# Patient Record
Sex: Male | Born: 1995 | Race: Black or African American | Hispanic: No | Marital: Single | Smoking: Never smoker
Health system: Southern US, Community
[De-identification: ages and names within clinical notes are randomized; demographics above are authoritative.]

## PROBLEM LIST (undated history)

## (undated) DIAGNOSIS — D649 Anemia, unspecified: Secondary | ICD-10-CM

## (undated) DIAGNOSIS — R9431 Abnormal electrocardiogram [ECG] [EKG]: Secondary | ICD-10-CM

## (undated) DIAGNOSIS — G43909 Migraine, unspecified, not intractable, without status migrainosus: Secondary | ICD-10-CM

## (undated) DIAGNOSIS — M545 Low back pain, unspecified: Secondary | ICD-10-CM

## (undated) DIAGNOSIS — J3089 Other allergic rhinitis: Secondary | ICD-10-CM

## (undated) DIAGNOSIS — Z973 Presence of spectacles and contact lenses: Secondary | ICD-10-CM

## (undated) DIAGNOSIS — Z9289 Personal history of other medical treatment: Secondary | ICD-10-CM

## (undated) HISTORY — DX: Other allergic rhinitis: J30.89

## (undated) HISTORY — DX: Low back pain: M54.5

## (undated) HISTORY — DX: Anemia, unspecified: D64.9

## (undated) HISTORY — PX: HERNIA REPAIR: SHX51

## (undated) HISTORY — DX: Personal history of other medical treatment: Z92.89

## (undated) HISTORY — DX: Low back pain, unspecified: M54.50

## (undated) HISTORY — DX: Migraine, unspecified, not intractable, without status migrainosus: G43.909

## (undated) HISTORY — DX: Abnormal electrocardiogram (ECG) (EKG): R94.31

## (undated) HISTORY — DX: Presence of spectacles and contact lenses: Z97.3

---
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
## 2014-12-15 ENCOUNTER — Ambulatory Visit (INDEPENDENT_AMBULATORY_CARE_PROVIDER_SITE_OTHER): Payer: 59 | Admitting: Medical

## 2014-12-15 ENCOUNTER — Encounter: Payer: Self-pay | Admitting: Medical

## 2014-12-15 VITALS — BP 100/80 | HR 67 | Temp 98.4°F | Resp 16 | Ht 66.0 in | Wt 162.0 lb

## 2014-12-15 DIAGNOSIS — Z0000 Encounter for general adult medical examination without abnormal findings: Secondary | ICD-10-CM | POA: Diagnosis not present

## 2014-12-15 DIAGNOSIS — Z9109 Other allergy status, other than to drugs and biological substances: Secondary | ICD-10-CM

## 2014-12-15 DIAGNOSIS — Z91048 Other nonmedicinal substance allergy status: Secondary | ICD-10-CM

## 2014-12-15 DIAGNOSIS — M7522 Bicipital tendinitis, left shoulder: Secondary | ICD-10-CM | POA: Diagnosis not present

## 2014-12-15 DIAGNOSIS — E739 Lactose intolerance, unspecified: Secondary | ICD-10-CM | POA: Diagnosis not present

## 2014-12-15 DIAGNOSIS — J45990 Exercise induced bronchospasm: Secondary | ICD-10-CM | POA: Insufficient documentation

## 2014-12-15 DIAGNOSIS — M545 Low back pain, unspecified: Secondary | ICD-10-CM | POA: Insufficient documentation

## 2014-12-15 DIAGNOSIS — R4586 Emotional lability: Secondary | ICD-10-CM

## 2014-12-15 DIAGNOSIS — G43809 Other migraine, not intractable, without status migrainosus: Secondary | ICD-10-CM

## 2014-12-15 DIAGNOSIS — F39 Unspecified mood [affective] disorder: Secondary | ICD-10-CM | POA: Diagnosis not present

## 2014-12-15 DIAGNOSIS — J4530 Mild persistent asthma, uncomplicated: Secondary | ICD-10-CM

## 2014-12-15 LAB — POCT URINALYSIS DIPSTICK
BILIRUBIN UA: NEGATIVE
GLUCOSE UA: NEGATIVE
KETONES UA: NEGATIVE
Leukocytes, UA: NEGATIVE
NITRITE UA: NEGATIVE
PROTEIN UA: NEGATIVE
RBC UA: NEGATIVE
Spec Grav, UA: 1.025
UROBILINOGEN UA: NEGATIVE
pH, UA: 6

## 2014-12-15 LAB — LIPID PANEL
CHOL/HDL RATIO: 2.9 ratio
Cholesterol: 141 mg/dL (ref 0–200)
HDL: 49 mg/dL (ref 31–65)
LDL Cholesterol: 79 mg/dL (ref 0–99)
Triglycerides: 66 mg/dL (ref <150)
VLDL: 13 mg/dL (ref 0–40)

## 2014-12-15 LAB — CBC
HCT: 38.2 % — ABNORMAL LOW (ref 39.0–52.0)
HEMOGLOBIN: 12.2 g/dL — AB (ref 13.0–17.0)
MCH: 25 pg — AB (ref 26.0–34.0)
MCHC: 31.9 g/dL (ref 30.0–36.0)
MCV: 78.3 fL (ref 78.0–100.0)
MPV: 9.6 fL (ref 8.6–12.4)
PLATELETS: 259 10*3/uL (ref 150–400)
RBC: 4.88 MIL/uL (ref 4.22–5.81)
RDW: 14.4 % (ref 11.5–15.5)
WBC: 4.9 10*3/uL (ref 4.0–10.5)

## 2014-12-15 LAB — TSH: TSH: 0.985 u[IU]/mL (ref 0.350–4.500)

## 2014-12-15 LAB — COMPREHENSIVE METABOLIC PANEL
ALBUMIN: 4.4 g/dL (ref 3.5–5.2)
ALT: 11 U/L (ref 0–53)
AST: 30 U/L (ref 0–37)
Alkaline Phosphatase: 47 U/L (ref 39–117)
BUN: 10 mg/dL (ref 6–23)
CHLORIDE: 104 meq/L (ref 96–112)
CO2: 23 mEq/L (ref 19–32)
Calcium: 9.5 mg/dL (ref 8.4–10.5)
Creat: 0.99 mg/dL (ref 0.50–1.35)
Glucose, Bld: 67 mg/dL — ABNORMAL LOW (ref 70–99)
Potassium: 4 mEq/L (ref 3.5–5.3)
Sodium: 141 mEq/L (ref 135–145)
TOTAL PROTEIN: 7.1 g/dL (ref 6.0–8.3)
Total Bilirubin: 0.5 mg/dL (ref 0.2–1.1)

## 2014-12-15 MED ORDER — MONTELUKAST SODIUM 10 MG PO TABS: 10.0000 mg | ORAL_TABLET | Freq: Every day | ORAL | Status: DC

## 2014-12-15 MED ORDER — BECLOMETHASONE DIPROPIONATE 80 MCG/ACT IN AERS: 2.0000 | INHALATION_SPRAY | Freq: Two times a day (BID) | RESPIRATORY_TRACT | Status: DC

## 2014-12-15 MED ORDER — ALBUTEROL SULFATE HFA 108 (90 BASE) MCG/ACT IN AERS: 2.0000 | INHALATION_SPRAY | Freq: Four times a day (QID) | RESPIRATORY_TRACT | Status: DC | PRN

## 2014-12-15 NOTE — Progress Notes (Signed)
Subjective:   HPI  Zachary Morris is a 19 y.o. male who presents for a complete physical.  New patient today.  He has a list of questions.   Preventative care: Last physical or labs: 2013 somewhere else Sees dentist yearly: No Last tetanus vaccine, TD or Tdap: 2008   Concerns: Asthma - using albuterol 2-3 times per week, gets flare ups with allergies  Allergies - year round and cuts onions every evening at work which gets him flared up  Mood changes, depressed mood - has had 3 significant deaths he hasn't dealt with .  Had 2 coaches that were close to him die, 1 friend die in the last few years, no prior counseling or treatment  Lactose intolerant  Hx/o pars defect, back pain, saw [facility], gets low back pain often.  Exercises regularly, lifts weights, very active  Has left shoulder pain, anteirorly, saw [facility] for this in the past  Not sleeping well  Gets migraines 3+ times per month, not sure of trigger, but does drink a lot of caffeine  Reviewed their medical, surgical, family, social, medication, and allergy history and updated chart as appropriate.  Past Medical History  Diagnosis Date  . Asthma   . Perennial allergic rhinitis   . Wears glasses   . Migraine 2015    Past Surgical History  Procedure Laterality Date  . Hernia repair      umbilical    History   Social History  . Marital Status: Single    Spouse Name: N/A  . Number of Children: N/A  . Years of Education: N/A   Occupational History  . Not on file.   Social History Main Topics  . Smoking status: Never Smoker   . Smokeless tobacco: Not on file  . Alcohol Use: No  . Drug Use: No  . Sexual Activity: No   Other Topics Concern  . 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  Exercise - 5 days per week, [employer].  No children.  No current relationship.  W[employer]. Plans to attend [employer], considering PT  as a career.    Family History  Problem Relation Age of Onset  . Asthma Mother   . Hypertension Father   . Appendicitis Brother   . Hypertension Maternal Grandmother   . Arthritis Maternal Grandmother   . Hypertension Maternal Grandfather   . Arthritis Maternal Grandfather      Current outpatient prescriptions:  .  albuterol (PROVENTIL HFA;VENTOLIN HFA) 108 (90 BASE) MCG/ACT inhaler, Inhale 2 puffs into the lungs every 6 (six) hours as needed., Disp: 1 Inhaler, Rfl: 0 .  beclomethasone (QVAR) 80 MCG/ACT inhaler, Inhale 2 puffs into the lungs 2 (two) times daily., Disp: 1 Inhaler, Rfl: 2 .  montelukast (SINGULAIR) 10 MG tablet, Take 1 tablet (10 mg total) by mouth at bedtime., Disp: 30 tablet, Rfl: 2  No Known Allergies   Review of Systems Constitutional: -fever, -chills, -sweats, -unexpected weight change, -decreased appetite, -fatigue Allergy: -sneezing, -itching, +congestion Dermatology: -changing moles, --rash, -lumps ENT: +runny nose, -ear pain, -sore throat, -hoarseness, -sinus pain, -teeth pain, - ringing in ears, -hearing loss, -nosebleeds Cardiology: -chest pain, -palpitations, -swelling, -difficulty breathing when lying flat, -waking up short of breath Respiratory: -cough, -shortness of breath, +difficulty breathing with exercise or exertion, -wheezing, -coughing up blood Gastroenterology: -abdominal pain, -nausea, -vomiting, -diarrhea, -constipation, -blood in stool, -changes in  bowel movement, -difficulty swallowing or eating Hematology: -bleeding, -bruising  Musculoskeletal: -joint aches, -muscle aches, -joint swelling, +back pain, -neck pain, -cramping, -changes in gait Ophthalmology: denies vision changes, eye redness, itching, discharge Urology: -burning with urination, -difficulty urinating, -blood in urine, -urinary frequency, -urgency, -incontinence Neurology: +headache, -weakness, -tingling, -numbness, -memory loss, -falls, -dizziness Psychology: +depressed  mood, +agitation, +sleep problems     Objective:   Physical Exam  BP 100/80 mmHg  Pulse 67  Temp(Src) 98.4 F (36.9 C) (Oral)  Resp 16  Ht  (1.676 m)  Wt 162 lb (73.483 kg)  BMI 26.16 kg/m2  General appearance: alert, no distress, WD/WN, lean muscular AA male Skin: left lower face with 2mm round hypopigmented lesion, no other rash HEENT: normocephalic, conjunctiva/corneas normal, sclerae anicteric, PERRLA, EOMi, nares patent, no discharge or erythema, pharynx normal Oral cavity: MMM, tongue normal, teeth normal Neck: supple, no lymphadenopathy, no thyromegaly, no masses, normal ROM, no bruits Chest: non tender, normal shape and expansion Heart: RRR, normal S1, S2, no murmurs Lungs: CTA bilaterally, no wheezes, rhonchi, or rales Abdomen: +bs, soft, non tender, non distended, no masses, no hepatomegaly, no splenomegaly, no bruits Back: non tender, normal ROM, no scoliosis Musculoskeletal: tender over left bicep tendon, upper extremities non tender, no obvious deformity, normal ROM throughout, lower extremities non tender, no obvious deformity, normal ROM throughout Extremities: no edema, no cyanosis, no clubbing Pulses: 2+ symmetric, upper and lower extremities, normal cap refill Neurological: alert, oriented x 3, CN2-12 intact, strength normal upper extremities and lower extremities, sensation normal throughout, DTRs 2+ throughout, no cerebellar signs, gait normal Psychiatric: normal affect, behavior normal, pleasant , but tearful talking about his mood concerns, loss GU: normal male external genitalia, circumcised, nontender, no masses, no hernia, no lymphadenopathy Rectal: deferred due to age 54yo and no indication today   Assessment and Plan :    Encounter Diagnoses  Name Primary?  . Encounter for health maintenance examination in adult Yes  . Extrinsic asthma, mild persistent, uncomplicated   . Environmental allergies   . Mood changes   . Low back pain without  sciatica, unspecified back pain laterality   . Biceps tendonitis on left   . Lactose intolerance   . Other type of migraine     Physical exam - discussed healthy lifestyle, diet, exercise, preventative care, vaccinations, and addressed their concerns.   See your dentist yearly for routine dental care including hygiene visits twice yearly. See your eye doctor yearly for routine vision care.  Routine labs today.  Vaccinations: None today.    Other concerns today: Asthma - begin Qvar inhaler for control, 2 puffs BID, albuterol prn  Allergies - begin singular daily QHS  Mood changes - advised counseling, f/u soon to discuss further  Low back pain - will request prior records  Biceps tendonitnis- discussed diagnosis, treatment, f/u 2wk  Lactose intolerance - discussed concerns, ways to manage  Migraines - advised he cut back on caffeine, headache diary, recheck  Follow up 44mo

## 2014-12-15 NOTE — Patient Instructions (Signed)
Encounter Diagnoses  Name Primary?  . Encounter for health maintenance examination in adult Yes  . Extrinsic asthma, mild persistent, uncomplicated   . Environmental allergies   . Mood changes   . Low back pain without sciatica, unspecified back pain laterality   . Biceps tendonitis on left   . Lactose intolerance    Recommendations:  Begin Qvar inhaler 2 puffs twice daily for asthma control;  This is a CONTROLLER medication meant to be taken twice daily for prevention.   This is NOT a rescue inhaler.  Rinse your mouth out with water after use  Continue Albuterol RESCUE inhaler as needed, 2 puffs every 6 hours for wheezing, shortness of breath  Allergies - begin Singulair tablet once daily at bedtime  Mood changes - return to discuss further.  I also recommend you seek counseling  [facility]  Biceps tendonitis - for the next 2 weeks, use ice, OTC Aleve once daily, cut down your upper body weight lifting by half the weight you are doing, and recheck in 2 weeks  Lactose intolerance - consider Lactaid supplement OTC or avoid dairy products.  substitute other foods such as soy based milk if you drink milk for example  Counseling services   [facility] [phone] office [email or web address] [address] [place], [place], [place] [address]  Gale JourneyLaura Atkinson, therapist  Franchot ErichsenErik Nelson, MA, clinical psychologist  Cognitive-Behavior Therapy; Mood Disorders; Anxiety Disorders; adult and child ADHD; Family Therapy; Stress Management; personal growth, and Marital Therapy.    Carlus Pavlovennis McKnight Ph.D., clinical psychologist Cognitive-Behavior Therapy; Mood Disorders; Anxiety Disorders; Stress     Management   [facility] [address], [place], [place] [address] [phone]   [facility] Sheran LuzDesiree Wilkinson, psychotherapist [address] [place], [place]  [address] [phone]   Miguel AschoffElaine Talbert Ph.D., clinical psychologist [phone] office [address] [place], [place] [address] Cognitive Behavior Therapy, Depression, Bipolar, Anxiety, Grief and Loss

## 2014-12-16 LAB — HEMOGLOBIN A1C
Hgb A1c MFr Bld: 5.5 % (ref <5.7)
Mean Plasma Glucose: 111 mg/dL (ref <117)

## 2014-12-16 NOTE — Progress Notes (Signed)
Patient notified. Follow up appointment scheduled

## 2014-12-30 ENCOUNTER — Ambulatory Visit: Payer: 59 | Admitting: Medical

## 2015-01-03 ENCOUNTER — Encounter: Payer: Self-pay | Admitting: Medical

## 2015-01-06 ENCOUNTER — Ambulatory Visit (INDEPENDENT_AMBULATORY_CARE_PROVIDER_SITE_OTHER): Payer: 59 | Admitting: Medical

## 2015-01-06 ENCOUNTER — Encounter: Payer: Self-pay | Admitting: Medical

## 2015-01-06 VITALS — BP 110/70 | HR 58 | Temp 98.0°F | Resp 15 | Wt 165.0 lb

## 2015-01-06 DIAGNOSIS — R799 Abnormal finding of blood chemistry, unspecified: Secondary | ICD-10-CM

## 2015-01-06 DIAGNOSIS — J4530 Mild persistent asthma, uncomplicated: Secondary | ICD-10-CM

## 2015-01-06 DIAGNOSIS — D6489 Other specified anemias: Secondary | ICD-10-CM

## 2015-01-06 DIAGNOSIS — G4700 Insomnia, unspecified: Secondary | ICD-10-CM | POA: Diagnosis not present

## 2015-01-06 DIAGNOSIS — E162 Hypoglycemia, unspecified: Secondary | ICD-10-CM

## 2015-01-06 DIAGNOSIS — Z130 Encounter for screening for diseases of the blood and blood-forming organs and certain disorders involving the immune mechanism: Secondary | ICD-10-CM

## 2015-01-06 DIAGNOSIS — Z8669 Personal history of other diseases of the nervous system and sense organs: Secondary | ICD-10-CM

## 2015-01-06 DIAGNOSIS — F39 Unspecified mood [affective] disorder: Secondary | ICD-10-CM

## 2015-01-06 DIAGNOSIS — R4586 Emotional lability: Secondary | ICD-10-CM

## 2015-01-06 DIAGNOSIS — J309 Allergic rhinitis, unspecified: Secondary | ICD-10-CM | POA: Diagnosis not present

## 2015-01-06 DIAGNOSIS — R7309 Other abnormal glucose: Secondary | ICD-10-CM

## 2015-01-06 LAB — CBC WITH DIFFERENTIAL/PLATELET
Basophils Absolute: 0 10*3/uL (ref 0.0–0.1)
Basophils Relative: 0 % (ref 0–1)
EOS ABS: 0.2 10*3/uL (ref 0.0–0.7)
EOS PCT: 4 % (ref 0–5)
HCT: 37.9 % — ABNORMAL LOW (ref 39.0–52.0)
HEMOGLOBIN: 12.1 g/dL — AB (ref 13.0–17.0)
LYMPHS ABS: 1.6 10*3/uL (ref 0.7–4.0)
LYMPHS PCT: 33 % (ref 12–46)
MCH: 24.8 pg — AB (ref 26.0–34.0)
MCHC: 31.9 g/dL (ref 30.0–36.0)
MCV: 77.8 fL — AB (ref 78.0–100.0)
MONO ABS: 0.4 10*3/uL (ref 0.1–1.0)
MPV: 9.7 fL (ref 8.6–12.4)
Monocytes Relative: 8 % (ref 3–12)
Neutro Abs: 2.7 10*3/uL (ref 1.7–7.7)
Neutrophils Relative %: 55 % (ref 43–77)
Platelets: 210 10*3/uL (ref 150–400)
RBC: 4.87 MIL/uL (ref 4.22–5.81)
RDW: 14.9 % (ref 11.5–15.5)
WBC: 4.9 10*3/uL (ref 4.0–10.5)

## 2015-01-06 NOTE — Progress Notes (Signed)
  Subjective:   HPI  Zachary Morris is a 19 y.o. male who presents for recheck.  At his last visit which is a physical we discussed several concerns.   Asthma -  Using Qvar BID, feels fine since last visit, not wheezing.  Using albuterol not often now.    Allergies - didn't realize he had prescription for singualir.  Is taking OTC antishitamine.  Doing ok regarding allergy sympotms since last visit.    Headache improved since last visit, has had 1 small headache since last visit.  Hasn't started counseling since last visit but plans to see one soon, friend of mothers.  Sleeping better since last visit.   Reviewed their medical, surgical, family, social, medication, and allergy history and updated chart as appropriate.  Past Medical History  Diagnosis Date  . Asthma   . Perennial allergic rhinitis   . Wears glasses   . Migraine 2015    Past Surgical History  Procedure Laterality Date  . Hernia repair      umbilical     Objective:   Physical Exam  BP 110/70 mmHg  Pulse 58  Temp(Src) 98 F (36.7 C) (Oral)  Resp 15  Wt 165 lb (74.844 kg)  General appearance: alert, no distress, WD/WN, lean muscular AA male Neck: supple, no lymphadenopathy, no thyromegaly, no masses, normal ROM, no bruits Heart: RRR, normal S1, S2, no murmurs Lungs: CTA bilaterally, no wheezes, rhonchi, or rales Extremities: no edema, no cyanosis, no clubbing Pulses: 2+ symmetric, upper and lower extremities, normal cap refill    Assessment and Plan :    Encounter Diagnoses  Name Primary?  Marland Kitchen. Asthma, extrinsic, mild persistent, uncomplicated Yes  . Allergic rhinitis, unspecified allergic rhinitis type   . Anemia due to other cause   . Screening for sickle-cell disease or trait   . Low glucose level   . Hx of migraines   . Insomnia   . Mood changes     Asthma - improved on current regimen, continue Qvar inhaler for control, 2 puffs BID, albuterol prn  Allergies - begin singular daily QHS,  continue over-the-counter antihistamine  We discussed abnormal labs from last visit, repeat labs today   Mood changes - advised counseling  Migraines - improved, limit caffeine, headache diary  Insomnia-improved  Follow up pending labs

## 2015-01-07 ENCOUNTER — Other Ambulatory Visit: Payer: Self-pay | Admitting: Medical

## 2015-01-07 LAB — IRON AND TIBC
%SAT: 7 % — AB (ref 20–55)
Iron: 25 ug/dL — ABNORMAL LOW (ref 42–165)
TIBC: 368 ug/dL (ref 215–435)
UIBC: 343 ug/dL (ref 125–400)

## 2015-01-07 LAB — SICKLE CELL SCREEN: Sickle Cell Screen: NEGATIVE

## 2015-01-07 LAB — GLUCOSE, RANDOM: GLUCOSE: 85 mg/dL (ref 70–99)

## 2015-01-07 LAB — CORTISOL-AM, BLOOD: Cortisol - AM: 9.5 ug/dL (ref 4.3–22.4)

## 2015-01-07 MED ORDER — FERROUS GLUCONATE 324 (38 FE) MG PO TABS: 324.0000 mg | ORAL_TABLET | Freq: Two times a day (BID) | ORAL | Status: DC

## 2016-02-14 ENCOUNTER — Ambulatory Visit (HOSPITAL_COMMUNITY)
Admission: EM | Admit: 2016-02-14 | Discharge: 2016-02-14 | Disposition: A | Discharge status: Home / Self Care | Payer: Managed Care, Other (non HMO) | Attending: Emergency Medicine | Admitting: Emergency Medicine

## 2016-02-14 ENCOUNTER — Ambulatory Visit (INDEPENDENT_AMBULATORY_CARE_PROVIDER_SITE_OTHER): Payer: Managed Care, Other (non HMO)

## 2016-02-14 ENCOUNTER — Encounter (HOSPITAL_COMMUNITY): Payer: Self-pay | Admitting: Emergency Medicine

## 2016-02-14 DIAGNOSIS — M25511 Pain in right shoulder: Secondary | ICD-10-CM | POA: Insufficient documentation

## 2016-02-14 DIAGNOSIS — Z79899 Other long term (current) drug therapy: Secondary | ICD-10-CM | POA: Insufficient documentation

## 2016-02-14 DIAGNOSIS — J45909 Unspecified asthma, uncomplicated: Secondary | ICD-10-CM | POA: Diagnosis not present

## 2016-02-14 DIAGNOSIS — S46911A Strain of unspecified muscle, fascia and tendon at shoulder and upper arm level, right arm, initial encounter: Secondary | ICD-10-CM | POA: Diagnosis not present

## 2016-02-14 DIAGNOSIS — Z8249 Family history of ischemic heart disease and other diseases of the circulatory system: Secondary | ICD-10-CM | POA: Diagnosis not present

## 2016-02-14 DIAGNOSIS — Z825 Family history of asthma and other chronic lower respiratory diseases: Secondary | ICD-10-CM | POA: Diagnosis not present

## 2016-02-14 DIAGNOSIS — Z202 Contact with and (suspected) exposure to infections with a predominantly sexual mode of transmission: Secondary | ICD-10-CM | POA: Diagnosis not present

## 2016-02-14 MED ORDER — MELOXICAM 15 MG PO TABS: 15.0000 mg | ORAL_TABLET | Freq: Every day | ORAL | Status: DC

## 2016-02-14 MED ORDER — CEFTRIAXONE SODIUM 250 MG IJ SOLR
INTRAMUSCULAR | Status: AC
  Filled 2016-02-14: qty 250

## 2016-02-14 MED ORDER — AZITHROMYCIN 250 MG PO TABS
1000.0000 mg | ORAL_TABLET | Freq: Once | ORAL | Status: AC
  Administered 2016-02-14: 1000 mg via ORAL

## 2016-02-14 MED ORDER — AZITHROMYCIN 250 MG PO TABS
ORAL_TABLET | ORAL | Status: AC
  Filled 2016-02-14: qty 4

## 2016-02-14 MED ORDER — CEFTRIAXONE SODIUM 250 MG IJ SOLR
250.0000 mg | Freq: Once | INTRAMUSCULAR | Status: AC
  Administered 2016-02-14: 250 mg via INTRAMUSCULAR

## 2016-02-14 MED ORDER — LIDOCAINE HCL (PF) 1 % IJ SOLN
INTRAMUSCULAR | Status: AC
  Filled 2016-02-14: qty 5

## 2016-02-14 NOTE — ED Notes (Signed)
Patient c/o right shoulder pain following some horse play. He has been using ice and heat and OTC pain relievers with no relief. Patient reports he also would like to be checked for Chylamydia as he had a partner tell him about 2 days ago she was positive for it. Patient is asymptomatic.

## 2016-02-14 NOTE — ED Provider Notes (Signed)
CSN: [id number]     Arrival date & time 02/14/16  1303 History   First MD Initiated Contact with Patient 02/14/16 1326     Chief Complaint  Patient presents with  . Shoulder Pain  . Exposure to STD   (Consider location/radiation/quality/duration/timing/severity/associated sxs/prior Treatment) HPI  He is a 20 year old man here for evaluation of right shoulder pain and exposure to STD. He states on Sunday he was messing around and jumped up to hit cement column. When he hit the column, he felt a pop in his right shoulder. He has had pain in the shoulder since then. He has done ice and ibuprofen without improvement. Pain is worse with overhead movements, causing some limited range of motion. He denies any radiating pain. No weakness. Pain is mostly in the anterior shoulder. He also states his trapezius on that side has been very tight.  He received a phone call 2 days ago that a former partner tested positive for chlamydia. He denies any dysuria or discharge.  Past Medical History  Diagnosis Date  . Asthma   . Perennial allergic rhinitis   . Wears glasses   . Migraine 2015   Past Surgical History  Procedure Laterality Date  . Hernia repair      umbilical   Family History  Problem Relation Age of Onset  . Asthma Mother   . Hypertension Father   . Appendicitis Brother   . Hypertension Maternal Grandmother   . Arthritis Maternal Grandmother   . Hypertension Maternal Grandfather   . Arthritis Maternal Grandfather    Social History  Substance Use Topics  . Smoking status: Never Smoker   . Smokeless tobacco: None  . Alcohol Use: No    Review of Systems As in history of present illness Allergies  Review of patient's allergies indicates no known allergies.  Home Medications   Prior to Admission medications   Medication Sig Start Date End Date Taking? Authorizing Provider  albuterol (PROVENTIL HFA;VENTOLIN HFA) 108 (90 BASE) MCG/ACT inhaler Inhale 2 puffs into the lungs every 6  (six) hours as needed. 12/15/14   Kermit Baloavid S Tysinger, PA-C  beclomethasone (QVAR) 80 MCG/ACT inhaler Inhale 2 puffs into the lungs 2 (two) times daily. 12/15/14   Kermit Baloavid S Tysinger, PA-C  ferrous gluconate (FERGON) 324 MG tablet Take 1 tablet (324 mg total) by mouth 2 (two) times daily with a meal. 01/07/15   Kermit Baloavid S Tysinger, PA-C  meloxicam (MOBIC) 15 MG tablet Take 1 tablet (15 mg total) by mouth daily. For 1 week, then as needed 02/14/16   Charm RingsErin J Rosine Solecki, MD   Meds Ordered and Administered this Visit   Medications  azithromycin [facility](ZITHROMAX) tablet 1,000 mg (1,000 mg Oral Given 02/14/16 1417)  cefTRIAXone (ROCEPHIN) injection 250 mg (250 mg Intramuscular Given 02/14/16 1417)    BP 145/103 mmHg  Pulse 75  Temp(Src) 98.5 F (36.9 C) (Oral)  Resp 16  SpO2 100% No data found.   Physical Exam  Constitutional: He is oriented to person, place, and time. He appears well-developed and well-nourished. No distress.  Cardiovascular: Normal rate.   Pulmonary/Chest: Effort normal.  Musculoskeletal:  Right shoulder: He does have some swelling at the ac joint as compared to the left.  He also has some spasm of the right trapezius. He is tender over the ac joint as well as the biceps tendon. Positive empty can. Negative Hawkins and Neer's. Normal biceps testing. He does have normal range of motion, but pain with abduction beyond 90. 2+  radial pulse.  Neurological: He is alert and oriented to person, place, and time.    ED Course  Procedures (including critical care time)  Labs Review Labs Reviewed  URINE CYTOLOGY ANCILLARY ONLY    Imaging Review Dg Shoulder Right  02/14/2016  CLINICAL DATA:  Base right arm and felt a popping sensation several days ago with pain anteriorly EXAM: RIGHT SHOULDER - 2+ VIEW COMPARISON:  None. FINDINGS: The right humeral head is in normal and the glenohumeral joint space appears normal. No significant posttraumatic or degenerative change is seen. The [facility] joint is normally aligned.  The ribs that are visualized are intact. IMPRESSION: Negative. Electronically Signed   By: Dwyane Dee M.D.   On: 02/14/2016 14:30      MDM   1. Right shoulder strain, initial encounter   2. STD exposure    I suspect he sprained the [facility] joint as well as possibly rotator cuff tendons. Treat with relative rest, ice, and meloxicam.  No weight lifting for 1 week. If not improving over the next week, follow-up with Delbert Harness. He was treated presumptively for gonorrhea and Chlamydia today. We will call with STD testing results. Follow-up as needed.    Charm Rings, MD 02/14/16 [phone]

## 2016-02-14 NOTE — ED Notes (Signed)
Patient reports no signs of reaction after antibiotic.

## 2016-02-14 NOTE — Discharge Instructions (Signed)
Your shoulder x-ray is normal. He likely strained the tendons and ligaments of the shoulder. Continue to apply ice at least 3 times a day. Take meloxicam daily for the next week, then as needed. No lifting for one week. This should gradually improve. If it is not getting better, follow up with Delbert HarnessMurphy Wainer.  We treated you for gonorrhea and Chlamydia today. We will call you with your results in 2-3 days. No sex for 1 week. Please make sure you use condoms.

## 2016-02-15 ENCOUNTER — Telehealth: Payer: Self-pay | Admitting: Medical

## 2016-02-15 LAB — URINE CYTOLOGY ANCILLARY ONLY
Chlamydia: POSITIVE — AB
NEISSERIA GONORRHEA: NEGATIVE
TRICH (WINDOWPATH): NEGATIVE

## 2016-02-15 NOTE — Telephone Encounter (Signed)
Please get in touch with the patient about recent visit to the emergency department.  Remind them to use us first for non emergency issues.  The emergency dept is expensive, should be used primarily for emergencies, and they should be encouraged to come here for non emergency problems.  This saves every body money, saves them time, and is better for continuity of care.  If they are ever unsure, they can always call here first, unless after hours or weekends.   

## 2016-02-17 NOTE — Telephone Encounter (Signed)
Pt is aware that he should come here for all non emergency visits. Stated he understood that we would prefer he use use verses the emergency in cases like this most recent visit which was not an emergency.

## 2016-02-17 NOTE — Telephone Encounter (Signed)
LMTCB

## 2016-02-28 ENCOUNTER — Telehealth: Payer: Self-pay | Admitting: Family Medicine

## 2016-02-28 NOTE — Telephone Encounter (Signed)
ER letter sent 

## 2016-05-28 ENCOUNTER — Ambulatory Visit (INDEPENDENT_AMBULATORY_CARE_PROVIDER_SITE_OTHER): Payer: Managed Care, Other (non HMO) | Admitting: Family Medicine

## 2016-05-28 ENCOUNTER — Encounter: Payer: Self-pay | Admitting: Family Medicine

## 2016-05-28 VITALS — BP 110/70 | HR 89 | Wt 170.0 lb

## 2016-05-28 DIAGNOSIS — J45990 Exercise induced bronchospasm: Secondary | ICD-10-CM | POA: Diagnosis not present

## 2016-05-28 DIAGNOSIS — L989 Disorder of the skin and subcutaneous tissue, unspecified: Secondary | ICD-10-CM | POA: Diagnosis not present

## 2016-05-28 MED ORDER — ALBUTEROL SULFATE HFA 108 (90 BASE) MCG/ACT IN AERS: 2.0000 | INHALATION_SPRAY | Freq: Four times a day (QID) | RESPIRATORY_TRACT | 1 refills | Status: DC | PRN

## 2016-05-28 NOTE — Progress Notes (Signed)
   Subjective:    Patient ID: Zachary Morris, male    DOB: [birth year], 20 y.o.   MRN: [id number]  HPI He has a lesion present on the right cheek for the last several weeks that he would like evaluated and he has very little difficulty with this. He would also like a refill on his albuterol. He does use this for exercise-induced asthma. He does have a previous history of asthma but has not been using any regular inhaler for quite some time.   Review of Systems     Objective:   Physical Exam Alert and in no distress. Round slightly dry lesion with underlying induration is noted on the right cheek.       Assessment & Plan:  Facial lesion - Plan: Ambulatory referral to Dermatology  Exercise-induced asthma - Plan: albuterol (PROVENTIL HFA;VENTOLIN HFA) 108 (90 Base) MCG/ACT inhaler The etiology of this lesion is unclear and I will therefore refer to dermatology.

## 2016-06-04 ENCOUNTER — Telehealth: Payer: Self-pay

## 2016-06-04 NOTE — Telephone Encounter (Signed)
LM on pt VCM due for physical and needs to CB. Trixie Rude/RLB

## 2016-06-27 ENCOUNTER — Ambulatory Visit (INDEPENDENT_AMBULATORY_CARE_PROVIDER_SITE_OTHER): Payer: Managed Care, Other (non HMO) | Admitting: Medical

## 2016-06-27 ENCOUNTER — Encounter: Payer: Self-pay | Admitting: Medical

## 2016-06-27 VITALS — BP 122/70 | HR 68 | Resp 18 | Ht 65.25 in | Wt 168.4 lb

## 2016-06-27 DIAGNOSIS — Z91048 Other nonmedicinal substance allergy status: Secondary | ICD-10-CM | POA: Diagnosis not present

## 2016-06-27 DIAGNOSIS — E739 Lactose intolerance, unspecified: Secondary | ICD-10-CM | POA: Diagnosis not present

## 2016-06-27 DIAGNOSIS — D509 Iron deficiency anemia, unspecified: Secondary | ICD-10-CM

## 2016-06-27 DIAGNOSIS — Z9109 Other allergy status, other than to drugs and biological substances: Secondary | ICD-10-CM

## 2016-06-27 DIAGNOSIS — Z0000 Encounter for general adult medical examination without abnormal findings: Secondary | ICD-10-CM

## 2016-06-27 DIAGNOSIS — Z8669 Personal history of other diseases of the nervous system and sense organs: Secondary | ICD-10-CM | POA: Diagnosis not present

## 2016-06-27 DIAGNOSIS — Z113 Encounter for screening for infections with a predominantly sexual mode of transmission: Secondary | ICD-10-CM | POA: Insufficient documentation

## 2016-06-27 DIAGNOSIS — J45990 Exercise induced bronchospasm: Secondary | ICD-10-CM | POA: Diagnosis not present

## 2016-06-27 LAB — CBC WITH DIFFERENTIAL/PLATELET
BASOS ABS: 0 {cells}/uL (ref 0–200)
Basophils Relative: 0 %
EOS ABS: 118 {cells}/uL (ref 15–500)
Eosinophils Relative: 2 %
HEMATOCRIT: 41.8 % (ref 38.5–50.0)
HEMOGLOBIN: 14.1 g/dL (ref 13.2–17.1)
LYMPHS ABS: 1475 {cells}/uL (ref 850–3900)
Lymphocytes Relative: 25 %
MCH: 27.1 pg (ref 27.0–33.0)
MCHC: 33.7 g/dL (ref 32.0–36.0)
MCV: 80.4 fL (ref 80.0–100.0)
MPV: 9.8 fL (ref 7.5–12.5)
Monocytes Absolute: 472 cells/uL (ref 200–950)
Monocytes Relative: 8 %
NEUTROS ABS: 3835 {cells}/uL (ref 1500–7800)
NEUTROS PCT: 65 %
Platelets: 186 10*3/uL (ref 140–400)
RBC: 5.2 MIL/uL (ref 4.20–5.80)
RDW: 13.7 % (ref 11.0–15.0)
WBC: 5.9 10*3/uL (ref 4.0–10.5)

## 2016-06-27 LAB — COMPREHENSIVE METABOLIC PANEL
ALBUMIN: 4.7 g/dL (ref 3.6–5.1)
ALT: 11 U/L (ref 9–46)
AST: 21 U/L (ref 10–40)
Alkaline Phosphatase: 44 U/L (ref 40–115)
BUN: 15 mg/dL (ref 7–25)
CALCIUM: 9.7 mg/dL (ref 8.6–10.3)
CHLORIDE: 104 mmol/L (ref 98–110)
CO2: 26 mmol/L (ref 20–31)
CREATININE: 1.19 mg/dL (ref 0.60–1.35)
Glucose, Bld: 80 mg/dL (ref 65–99)
Potassium: 4.2 mmol/L (ref 3.5–5.3)
Sodium: 140 mmol/L (ref 135–146)
TOTAL PROTEIN: 7.2 g/dL (ref 6.1–8.1)
Total Bilirubin: 0.6 mg/dL (ref 0.2–1.2)

## 2016-06-27 LAB — POCT URINALYSIS DIPSTICK
BILIRUBIN UA: NEGATIVE
Blood, UA: NEGATIVE
GLUCOSE UA: NEGATIVE
KETONES UA: NEGATIVE
LEUKOCYTES UA: NEGATIVE
Nitrite, UA: NEGATIVE
PROTEIN UA: NEGATIVE
Urobilinogen, UA: NEGATIVE
pH, UA: 6

## 2016-06-27 LAB — HIV ANTIBODY (ROUTINE TESTING W REFLEX): HIV: NONREACTIVE

## 2016-06-27 MED ORDER — ALBUTEROL SULFATE HFA 108 (90 BASE) MCG/ACT IN AERS
2.0000 | INHALATION_SPRAY | Freq: Four times a day (QID) | RESPIRATORY_TRACT | 2 refills | Status: DC | PRN
Start: 2016-06-27 — End: 2017-09-06

## 2016-06-27 MED ORDER — FLUTICASONE PROPIONATE HFA 110 MCG/ACT IN AERO: 2.0000 | INHALATION_SPRAY | Freq: Every day | RESPIRATORY_TRACT | 2 refills | Status: DC

## 2016-06-27 NOTE — Addendum Note (Signed)
Addended by: Debbrah AlarSMITH, Emilyrose Darrah F on: 06/27/2016 03:37 PM   Modules accepted: Orders

## 2016-06-27 NOTE — Progress Notes (Signed)
Subjective:   HPI  Zachary Morris is a 20 y.o. male who presents for a complete physical.    Concerns: Exercise - [employer], weights, some cardio  Iron deficiency anemia - eating lots of vegetables.  Feels improved from last year.  Not taking iron.   Sexually active, using condoms.   Asthma -  using albuterol 2-3 times per week, gets flare ups with allergies  Allergies - more seasonal in past year  Denies depressed mood, sometimes worrisome about success , failure.   training to become personal trainer, and wants to go back to school to become a International aid/development worker.  Reviewed their medical, surgical, family, social, medication, and allergy history and updated chart as appropriate.  Past Medical History:  Diagnosis Date  . Asthma   . Migraine 2015  . Perennial allergic rhinitis   . Wears glasses     Past Surgical History:  Procedure Laterality Date  . HERNIA REPAIR     umbilical    Social History   Social History  . Marital status: Single    Spouse name: N/A  . Number of children: N/A  . Years of education: N/A   Occupational History  . Not on file.   Social History Main Topics  . Smoking status: Never Smoker  . Smokeless tobacco: Not on file  . Alcohol use No  . Drug use: No  . Sexual activity: No   Other Topics Concern  . 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  Exercise - 5 days per week, [employer].  No children.  No current relationship.  Went to [employer]. Plans to attend [employer], considering PT as a career.    Family History  Problem Relation Age of Onset  . Asthma Mother   . Hypertension Father   . Appendicitis Brother   . Hypertension Maternal Grandmother   . Arthritis Maternal Grandmother   . Hypertension Maternal Grandfather   . Arthritis Maternal Grandfather      Current Outpatient Prescriptions:  .  albuterol (PROVENTIL HFA;VENTOLIN HFA) 108 (90 Base) MCG/ACT inhaler,  Inhale 2 puffs into the lungs every 6 (six) hours as needed., Disp: 1 Inhaler, Rfl: 1  No Known Allergies   Review of Systems Constitutional: -fever, -chills, -sweats, -unexpected weight change, -decreased appetite, -fatigue Allergy: -sneezing, -itching, -congestion Dermatology: -changing moles, --rash, -lumps ENT: -runny nose, -ear pain, -sore throat, -hoarseness, -sinus pain, -teeth pain, - ringing in ears, -hearing loss, -nosebleeds Cardiology: -chest pain, -palpitations, -swelling, -difficulty breathing when lying flat, -waking up short of breath Respiratory: -cough, -shortness of breath, -difficulty breathing with exercise or exertion, -wheezing, -coughing up blood Gastroenterology: -abdominal pain, -nausea, -vomiting, -diarrhea, -constipation, -blood in stool, -changes in bowel movement, -difficulty swallowing or eating Hematology: -bleeding, -bruising  Musculoskeletal: -joint aches, -muscle aches, -joint swelling, +back pain, -neck pain, -cramping, -changes in gait Ophthalmology: denies vision changes, eye redness, itching, discharge Urology: -burning with urination, -difficulty urinating, -blood in urine, -urinary frequency, -urgency, -incontinence Neurology: -headache, -weakness, -tingling, -numbness, -memory loss, -falls, -dizziness Psychology: -depressed mood, -agitation, -sleep problems     Objective:   Physical Exam  BP 122/70   Pulse 68   Resp 18   Ht 5' 5.25" (1.657 m)   Wt 168 lb 6.4 oz (76.4 kg)   BMI 27.81 kg/m   General appearance: alert, no distress, WD/WN, lean muscular AA male Skin: small 1 mm brown flat macules of  dorsal distal left 5th finger over DIP, no other worrisome lesions, tattoo right forearm anteriorly HEENT: normocephalic, conjunctiva/corneas normal, sclerae anicteric, PERRLA, EOMi, nares patent, no discharge or erythema, pharynx normal Oral cavity: MMM, tongue normal, teeth normal, whitish patches bilat buccal mucosa from biting  Neck: supple, no  lymphadenopathy, no thyromegaly, no masses, normal ROM, no bruits Chest: non tender, normal shape and expansion Heart: RRR, normal S1, S2, no murmurs Lungs: CTA bilaterally, no wheezes, rhonchi, or rales Abdomen: +bs, soft, non tender, non distended, no masses, no hepatomegaly, no splenomegaly, no bruits Back: non tender, normal ROM, no scoliosis Musculoskeletal: tender over left bicep tendon, upper extremities non tender, no obvious deformity, normal ROM throughout, lower extremities non tender, no obvious deformity, normal ROM throughout Extremities: no edema, no cyanosis, no clubbing Pulses: 2+ symmetric, upper and lower extremities, normal cap refill Neurological: alert, oriented x 3, CN2-12 intact, strength normal upper extremities and lower extremities, sensation normal throughout, DTRs 2+ throughout, no cerebellar signs, gait normal Psychiatric: normal affect, behavior normal, pleasant , but tearful talking about his mood concerns, loss GU: normal male external genitalia, circumcised, non tender, no masses, no hernia, no lymphadenopathy Rectal: deferred due to age 20yo and no indication today   Assessment and Plan :    Encounter Diagnoses  Name Primary?  . Annual physical exam Yes  . Exercise-induced asthma   . Lactose intolerance   . Environmental allergies   . History of migraine   . Anemia, iron deficiency   . Screen for STD (sexually transmitted disease)     Physical exam - discussed healthy lifestyle, diet, exercise, preventative care, vaccinations, and addressed their concerns.   See your dentist yearly for routine dental care including hygiene visits twice yearly. See your eye doctor yearly for routine vision care.  Routine labs today.  Vaccinations: Recommended flu shot but he declines.   Will be due for Tdap next year  Other concerns today: Asthma - begin Flovent inhaler for control, 2 puffs daily, albuterol prn  Allergies - c/t OTC antihistamine prior to  seasonal flares  Follow up  Pending labs  Stacie was seen today for annual exam.  Diagnoses and all orders for this visit:  Annual physical exam -     Urinalysis Dipstick -     Cancel: Visual acuity screening -     CBC with Differential/Platelet -     Comprehensive metabolic panel -     Iron and TIBC -     HIV antibody -     RPR -     GC/chlamydia probe amp, urine  Exercise-induced asthma -     albuterol (PROVENTIL HFA;VENTOLIN HFA) 108 (90 Base) MCG/ACT inhaler; Inhale 2 puffs into the lungs every 6 (six) hours as needed.  Lactose intolerance  Environmental allergies  History of migraine  Anemia, iron deficiency -     CBC with Differential/Platelet -     Iron and TIBC  Screen for STD (sexually transmitted disease) -     HIV antibody -     RPR -     GC/chlamydia probe amp, urine  Other orders -     fluticasone (FLOVENT HFA) 110 MCG/ACT inhaler; Inhale 2 puffs into the lungs daily.

## 2016-06-27 NOTE — Patient Instructions (Signed)
Begin trial of Flovent inhaler, 2 puffs daily about 30-60 minutes before exercise.  Rinse out mouth with water a few minutes after use  Use Albuterol inhaler as needed, 2 puffs every 4- 6 hours, or 2 puffs prior to aerobic execise  Use OTC antihistamine daily prior to allergy season.  We will cal with lab results.

## 2016-06-28 LAB — RPR

## 2016-06-28 LAB — GC/CHLAMYDIA PROBE AMP
CT PROBE, AMP APTIMA: NOT DETECTED
GC PROBE AMP APTIMA: NOT DETECTED

## 2016-08-20 ENCOUNTER — Telehealth: Payer: Self-pay

## 2016-08-20 ENCOUNTER — Other Ambulatory Visit: Payer: Self-pay | Admitting: Medical

## 2016-08-20 MED ORDER — FLUTICASONE PROPIONATE HFA 110 MCG/ACT IN AERO: 2.0000 | INHALATION_SPRAY | Freq: Every day | RESPIRATORY_TRACT | 5 refills | Status: DC

## 2016-08-20 NOTE — Telephone Encounter (Signed)
Fax request rcvd for refill of flovent to [facility]. Thanks, RLB

## 2016-08-22 NOTE — Telephone Encounter (Signed)
This encounter was created in error - please disregard.

## 2017-09-05 ENCOUNTER — Encounter: Payer: Self-pay | Admitting: Medical

## 2017-09-05 ENCOUNTER — Ambulatory Visit (INDEPENDENT_AMBULATORY_CARE_PROVIDER_SITE_OTHER): Payer: 59 | Admitting: Medical

## 2017-09-05 VITALS — BP 126/80 | HR 81 | Ht 65.0 in | Wt 171.8 lb

## 2017-09-05 DIAGNOSIS — Z0000 Encounter for general adult medical examination without abnormal findings: Secondary | ICD-10-CM | POA: Insufficient documentation

## 2017-09-05 DIAGNOSIS — J45990 Exercise induced bronchospasm: Secondary | ICD-10-CM

## 2017-09-05 DIAGNOSIS — Z9109 Other allergy status, other than to drugs and biological substances: Secondary | ICD-10-CM | POA: Diagnosis not present

## 2017-09-05 DIAGNOSIS — Z113 Encounter for screening for infections with a predominantly sexual mode of transmission: Secondary | ICD-10-CM | POA: Diagnosis not present

## 2017-09-05 DIAGNOSIS — Z23 Encounter for immunization: Secondary | ICD-10-CM | POA: Insufficient documentation

## 2017-09-05 DIAGNOSIS — Z8669 Personal history of other diseases of the nervous system and sense organs: Secondary | ICD-10-CM

## 2017-09-05 LAB — POCT URINALYSIS DIP (PROADVANTAGE DEVICE)
BILIRUBIN UA: NEGATIVE
GLUCOSE UA: NEGATIVE mg/dL
Ketones, POC UA: NEGATIVE mg/dL
Leukocytes, UA: NEGATIVE
Nitrite, UA: NEGATIVE
PH UA: 6 (ref 5.0–8.0)
RBC UA: NEGATIVE
SPECIFIC GRAVITY, URINE: 1.03
UUROB: NEGATIVE

## 2017-09-05 NOTE — Progress Notes (Signed)
Subjective:   HPI  Zachary Morris is a 21 y.o. male who presents for a complete physical.  Doing well, no particular issues.  Concerns: Exercise - [employer], weights, some cardio 1 yesterday and early way up the lateral as 9 mm positive Sexually active, using condoms.   Asthma -has had very little issues with asthma this past year, using albuterol as needed  Allergies - more seasonal in past year  Reviewed their medical, surgical, family, social, medication, and allergy history and updated chart as appropriate.  Past Medical History:  Diagnosis Date  . Abnormal EKG 08/2010   Subsequent normal echocardiogram, [facility]  . Anemia   . Asthma   . H/O echocardiogram 08/2010   Normal, Dr. Cristy FolksGregory Fleming, pediatric cardiology [facility]  . Low back pain   . Migraine 2015  . Perennial allergic rhinitis   . Wears glasses     Past Surgical History:  Procedure Laterality Date  . HERNIA REPAIR     umbilical    Social History   Socioeconomic History  . Marital status: Single    Spouse name: Not on file  . Number of children: Not on file  . Years of education: Not on file  . Highest education level: Not on file  Social Needs  . Financial resource strain: Not on file  . Food insecurity - worry: Not on file  . Food insecurity - inability: Not on file  . Transportation needs - medical: Not on file  . Transportation needs - non-medical: Not on file  Occupational History  . Not on file  Tobacco Use  . Smoking status: Never Smoker  . Smokeless tobacco: Never Used  Substance and Sexual Activity  . Alcohol use: No  . Drug use: No  . Sexual activity: No  Other Topics Concern  . 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  Exercise - 5 days per week, [employer].  No children.  [employer].  [employer], considering athletic training as a Event organisercareer.  Will begin [employer] [place] 2019.  08/2017     Family History  Problem Relation Age of Onset  . Asthma Mother   . Hypertension Father   . Appendicitis Brother   . Hypertension Maternal Grandmother   . Arthritis Maternal Grandmother   . Diabetes Maternal Grandmother   . Hypertension Maternal Grandfather   . Arthritis Maternal Grandfather   . Cancer Maternal Aunt        cervical cancer     Current Outpatient Medications:  .  albuterol (PROVENTIL HFA;VENTOLIN HFA) 108 (90 Base) MCG/ACT inhaler, Inhale 2 puffs into the lungs every 6 (six) hours as needed., Disp: 1 Inhaler, Rfl: 2  No Known Allergies   Review of Systems Constitutional: -fever, -chills, -sweats, -unexpected weight change, -decreased appetite, -fatigue Allergy: -sneezing, -itching, -congestion Dermatology: -changing moles, --rash, -lumps ENT: -runny nose, -ear pain, -sore throat, -hoarseness, -sinus pain, -teeth pain, - ringing in ears, -hearing loss, -nosebleeds Cardiology: -chest pain, -palpitations, -swelling, -difficulty breathing when lying flat, -waking up short of breath Respiratory: -cough, -shortness of breath, -difficulty breathing with exercise or exertion, -wheezing, -coughing up blood Gastroenterology: -abdominal pain, -nausea, -vomiting, -diarrhea, -constipation, -blood in stool, -changes in bowel movement, -difficulty swallowing or eating Hematology: -bleeding, -bruising  Musculoskeletal: -joint aches, -muscle aches, -joint swelling, +back pain, -neck pain, -cramping, -changes in gait Ophthalmology: denies vision changes, eye redness, itching, discharge Urology: -burning  with urination, -difficulty urinating, -blood in urine, -urinary frequency, -urgency, -incontinence Neurology: -headache, -weakness, -tingling, -numbness, -memory loss, -falls, -dizziness Psychology: -depressed mood, -agitation, -sleep problems     Objective:   Physical Exam  BP 126/80   Pulse 81   Ht 5\' 5"  (1.651 m)   Wt 171 lb 12.8 oz (77.9 kg)   SpO2 98%   BMI 28.59  kg/m   General appearance: alert, no distress, WD/WN, lean muscular AA male Skin: small 1 mm brown flat macules of dorsal distal left 5th finger over DIP, no other worrisome lesions, tattoo right forearm anteriorly HEENT: normocephalic, conjunctiva/corneas normal, sclerae anicteric, PERRLA, EOMi, nares patent, no discharge or erythema, pharynx normal Oral cavity: MMM, tongue normal, teeth normal, no lesions Neck: supple, no lymphadenopathy, no thyromegaly, no masses, normal ROM, no bruits Chest: non tender, normal shape and expansion Heart: RRR, normal S1, S2, no murmurs Lungs: CTA bilaterally, no wheezes, rhonchi, or rales Abdomen: +bs, soft, non tender, non distended, no masses, no hepatomegaly, no splenomegaly, no bruits Back: non tender, normal ROM, no scoliosis Musculoskeletal: tender over left bicep tendon, upper extremities non tender, no obvious deformity, normal ROM throughout, lower extremities non tender, no obvious deformity, normal ROM throughout Extremities: no edema, no cyanosis, no clubbing Pulses: 2+ symmetric, upper and lower extremities, normal cap refill Neurological: alert, oriented x 3, CN2-12 intact, strength normal upper extremities and lower extremities, sensation normal throughout, DTRs 2+ throughout, no cerebellar signs, gait normal Psychiatric: normal affect, behavior normal, pleasant , but tearful talking about his mood concerns, loss GU: normal male external genitalia, circumcised, non tender, no masses, no hernia, no lymphadenopathy Rectal: deferred due to age 89yo and no indication today   Assessment and Plan :    Encounter Diagnoses  Name Primary?  . Encounter for health maintenance examination in adult Yes  . History of migraine   . Environmental allergies   . Exercise-induced asthma   . Screen for STD (sexually transmitted disease)   . Need for Tdap vaccination     Physical exam - discussed healthy lifestyle, diet, exercise, preventative care,  vaccinations, and addressed their concerns.   See your dentist yearly for routine dental care including hygiene visits twice yearly. See your eye doctor yearly for routine vision care.  Routine labs today.  STD labs, discussed safe sex, condom use.  Vaccinations: Recommended flu shot but he declines.   Counseled on the Tdap (tetanus, diptheria, and acellular pertussis) vaccine.  Vaccine information sheet given. Tdap vaccine given after consent obtained.  Other concerns today: Asthma -continue albuterol prn  Allergies - c/t OTC antihistamine prior to seasonal flares  History of migraines-no recent problems  Follow up  Pending labs  Simon was seen today for annual exam.  Diagnoses and all orders for this visit:  Encounter for health maintenance examination in adult -     HIV antibody -     RPR -     C. trachomatis/N. gonorrhoeae RNA -     CBC -     Basic metabolic panel  History of migraine  Environmental allergies  Exercise-induced asthma  Screen for STD (sexually transmitted disease) -     HIV antibody -     RPR -     C. trachomatis/N. gonorrhoeae RNA  Need for Tdap vaccination

## 2017-09-06 ENCOUNTER — Other Ambulatory Visit: Payer: Self-pay

## 2017-09-06 DIAGNOSIS — J45990 Exercise induced bronchospasm: Secondary | ICD-10-CM

## 2017-09-06 LAB — BASIC METABOLIC PANEL
BUN: 15 mg/dL (ref 7–25)
CHLORIDE: 105 mmol/L (ref 98–110)
CO2: 25 mmol/L (ref 20–32)
Calcium: 9.3 mg/dL (ref 8.6–10.3)
Creat: 1.11 mg/dL (ref 0.60–1.35)
GLUCOSE: 81 mg/dL (ref 65–99)
POTASSIUM: 3.9 mmol/L (ref 3.5–5.3)
SODIUM: 138 mmol/L (ref 135–146)

## 2017-09-06 LAB — CBC
HEMATOCRIT: 42.8 % (ref 38.5–50.0)
Hemoglobin: 14.1 g/dL (ref 13.2–17.1)
MCH: 26.1 pg — ABNORMAL LOW (ref 27.0–33.0)
MCHC: 32.9 g/dL (ref 32.0–36.0)
MCV: 79.3 fL — ABNORMAL LOW (ref 80.0–100.0)
MPV: 10.6 fL (ref 7.5–12.5)
PLATELETS: 197 10*3/uL (ref 140–400)
RBC: 5.4 10*6/uL (ref 4.20–5.80)
RDW: 13.1 % (ref 11.0–15.0)
WBC: 4.2 10*3/uL (ref 3.8–10.8)

## 2017-09-06 LAB — C. TRACHOMATIS/N. GONORRHOEAE RNA
C. trachomatis RNA, TMA: NOT DETECTED
N. gonorrhoeae RNA, TMA: NOT DETECTED

## 2017-09-06 LAB — RPR: RPR Ser Ql: NONREACTIVE

## 2017-09-06 LAB — HIV ANTIBODY (ROUTINE TESTING W REFLEX): HIV: NONREACTIVE

## 2017-09-06 MED ORDER — ALBUTEROL SULFATE HFA 108 (90 BASE) MCG/ACT IN AERS: 2.0000 | INHALATION_SPRAY | Freq: Four times a day (QID) | RESPIRATORY_TRACT | 2 refills | Status: DC | PRN

## 2017-11-26 ENCOUNTER — Encounter (HOSPITAL_COMMUNITY): Payer: Self-pay | Admitting: Emergency Medicine

## 2017-11-26 ENCOUNTER — Ambulatory Visit (HOSPITAL_COMMUNITY)
Admission: EM | Admit: 2017-11-26 | Discharge: 2017-11-26 | Disposition: A | Discharge status: Home / Self Care | Payer: 59 | Attending: Emergency Medicine | Admitting: Emergency Medicine

## 2017-11-26 DIAGNOSIS — Z8249 Family history of ischemic heart disease and other diseases of the circulatory system: Secondary | ICD-10-CM | POA: Insufficient documentation

## 2017-11-26 DIAGNOSIS — E739 Lactose intolerance, unspecified: Secondary | ICD-10-CM | POA: Insufficient documentation

## 2017-11-26 DIAGNOSIS — J45909 Unspecified asthma, uncomplicated: Secondary | ICD-10-CM | POA: Insufficient documentation

## 2017-11-26 DIAGNOSIS — Z833 Family history of diabetes mellitus: Secondary | ICD-10-CM | POA: Insufficient documentation

## 2017-11-26 DIAGNOSIS — Z113 Encounter for screening for infections with a predominantly sexual mode of transmission: Secondary | ICD-10-CM

## 2017-11-26 DIAGNOSIS — Z202 Contact with and (suspected) exposure to infections with a predominantly sexual mode of transmission: Secondary | ICD-10-CM | POA: Diagnosis not present

## 2017-11-26 DIAGNOSIS — Z79899 Other long term (current) drug therapy: Secondary | ICD-10-CM | POA: Diagnosis not present

## 2017-11-26 DIAGNOSIS — Z9889 Other specified postprocedural states: Secondary | ICD-10-CM | POA: Diagnosis not present

## 2017-11-26 DIAGNOSIS — Z808 Family history of malignant neoplasm of other organs or systems: Secondary | ICD-10-CM | POA: Insufficient documentation

## 2017-11-26 DIAGNOSIS — Z8261 Family history of arthritis: Secondary | ICD-10-CM | POA: Diagnosis not present

## 2017-11-26 DIAGNOSIS — Z825 Family history of asthma and other chronic lower respiratory diseases: Secondary | ICD-10-CM | POA: Insufficient documentation

## 2017-11-26 MED ORDER — AZITHROMYCIN 250 MG PO TABS
1000.0000 mg | ORAL_TABLET | Freq: Once | ORAL | Status: AC
  Administered 2017-11-26: 1000 mg via ORAL

## 2017-11-26 MED ORDER — CEFTRIAXONE SODIUM 250 MG IJ SOLR
250.0000 mg | Freq: Once | INTRAMUSCULAR | Status: AC
  Administered 2017-11-26: 250 mg via INTRAMUSCULAR

## 2017-11-26 MED ORDER — AZITHROMYCIN 250 MG PO TABS
ORAL_TABLET | ORAL | Status: AC
  Filled 2017-11-26: qty 4

## 2017-11-26 MED ORDER — CEFTRIAXONE SODIUM 250 MG IJ SOLR
INTRAMUSCULAR | Status: AC
  Filled 2017-11-26: qty 250

## 2017-11-26 MED ORDER — LIDOCAINE HCL (PF) 1 % IJ SOLN
INTRAMUSCULAR | Status: AC
  Filled 2017-11-26: qty 2

## 2017-11-26 NOTE — Discharge Instructions (Signed)
Use protection  Expressed that we would send urine and if anything positive we would call pt.

## 2017-11-26 NOTE — ED Provider Notes (Signed)
[facility]    CSN: [id number] Arrival date & time: 11/26/17  1442     History   Chief Complaint Chief Complaint  Patient presents with  . Exposure to STD    HPI Zachary Morris is a 22 y.o. male.   Pt is here due to someone he had intercourse with approx 2 weeks ago expressed to him that she tested positive for an STI. Pt denies any sx but would like to be treated. Denies any reason to have HIV testing. Would like to send urine.       Past Medical History:  Diagnosis Date  . Abnormal EKG 08/2010   Subsequent normal echocardiogram, [facility]  . Anemia   . Asthma   . H/O echocardiogram 08/2010   Normal, Dr. Cristy FolksGregory Fleming, pediatric cardiology [facility]  . Low back pain   . Migraine 2015  . Perennial allergic rhinitis   . Wears glasses     Patient Active Problem List   Diagnosis Date Noted  . Encounter for health maintenance examination in adult 09/05/2017  . Need for Tdap vaccination 09/05/2017  . History of migraine 06/27/2016  . Screen for STD (sexually transmitted disease) 06/27/2016  . Exercise-induced asthma 12/15/2014  . Environmental allergies 12/15/2014  . Lactose intolerance 12/15/2014    Past Surgical History:  Procedure Laterality Date  . HERNIA REPAIR     umbilical       Home Medications    Prior to Admission medications   Medication Sig Start Date End Date Taking? Authorizing Provider  albuterol (PROVENTIL HFA;VENTOLIN HFA) 108 (90 Base) MCG/ACT inhaler Inhale 2 puffs into the lungs every 6 (six) hours as needed. 09/06/17   Tysinger, Kermit Baloavid S, PA-C  montelukast (SINGULAIR) 10 MG tablet Take 1 tablet (10 mg total) by mouth at bedtime. Patient not taking: Reported on 01/06/2015 12/15/14 02/14/16  Tysinger, Kermit Baloavid S, PA-C    Family History Family History  Problem Relation Age of Onset  . Asthma Mother   . Hypertension Father   . Appendicitis Brother   . Hypertension Maternal Grandmother   . Arthritis Maternal  Grandmother   . Diabetes Maternal Grandmother   . Hypertension Maternal Grandfather   . Arthritis Maternal Grandfather   . Cancer Maternal Aunt        cervical cancer    Social History Social History   Tobacco Use  . Smoking status: Never Smoker  . Smokeless tobacco: Never Used  Substance Use Topics  . Alcohol use: No  . Drug use: No     Allergies   Patient has no known allergies.   Review of Systems Review of Systems  Constitutional: Negative.   Respiratory: Negative.   Cardiovascular: Negative.   Gastrointestinal: Negative.   Genitourinary: Negative.   Skin: Negative.      Physical Exam Triage Vital Signs ED Triage Vitals  Enc Vitals Group     BP 11/26/17 1557 140/62     Pulse Rate 11/26/17 1557 68     Resp 11/26/17 1557 16     Temp 11/26/17 1557 99.1 F (37.3 C)     Temp Source 11/26/17 1557 Oral     SpO2 11/26/17 1557 100 %     Weight 11/26/17 1556 175 lb (79.4 kg)     Height --      Head Circumference --      Peak Flow --      Pain Score 11/26/17 1556 0     Pain Loc --  Pain Edu? --      Excl. in GC? --    No data found.  Updated Vital Signs BP 140/62   Pulse 68   Temp 99.1 F (37.3 C) (Oral)   Resp 16   Wt 175 lb (79.4 kg)   SpO2 100%   BMI 29.12 kg/m   Visual Acuity Right Eye Distance:   Left Eye Distance:   Bilateral Distance:    Right Eye Near:   Left Eye Near:    Bilateral Near:     Physical Exam  Constitutional: He appears well-developed.  Cardiovascular: Normal rate and regular rhythm.  Pulmonary/Chest: Effort normal and breath sounds normal.  Abdominal: Soft. Bowel sounds are normal.  Genitourinary: Penis normal.  Neurological: He is alert.     UC Treatments / Results  Labs (all labs ordered are listed, but only abnormal results are displayed) Labs Reviewed  URINE CYTOLOGY ANCILLARY ONLY    EKG  EKG Interpretation None       Radiology No results found.  Procedures Procedures (including critical  care time)  Medications Ordered in UC Medications  cefTRIAXone (ROCEPHIN) injection 250 mg (not administered)  azithromycin (ZITHROMAX) tablet 1,000 mg (not administered)     Initial Impression / Assessment and Plan / UC Course  I have reviewed the triage vital signs and the nursing notes.  Pertinent labs & imaging results that were available during my care of the patient were reviewed by me and considered in my medical decision making (see chart for details).    Expressed that we would send urine and if anything positive we would call pt.  Pt would like to be treated today does not feel the need for HIV or blood testing.   Final Clinical Impressions(s) / UC Diagnoses   Final diagnoses:  STD exposure    ED Discharge Orders    None       Controlled Substance Prescriptions [place] Controlled Substance Registry consulted? Not Applicable   Coralyn Mark, NP 11/26/17 [phone]

## 2017-11-26 NOTE — ED Triage Notes (Signed)
Requests STD check, no symptoms

## 2017-11-27 LAB — URINE CYTOLOGY ANCILLARY ONLY
CHLAMYDIA, DNA PROBE: NEGATIVE
Neisseria Gonorrhea: NEGATIVE
Trichomonas: NEGATIVE

## 2018-09-17 ENCOUNTER — Ambulatory Visit (INDEPENDENT_AMBULATORY_CARE_PROVIDER_SITE_OTHER): Payer: 59 | Admitting: Medical

## 2018-09-17 ENCOUNTER — Encounter: Payer: Self-pay | Admitting: Medical

## 2018-09-17 VITALS — BP 120/72 | HR 63 | Temp 98.1°F | Resp 16 | Ht 65.25 in | Wt 175.8 lb

## 2018-09-17 DIAGNOSIS — Z0000 Encounter for general adult medical examination without abnormal findings: Secondary | ICD-10-CM | POA: Diagnosis not present

## 2018-09-17 DIAGNOSIS — Z2821 Immunization not carried out because of patient refusal: Secondary | ICD-10-CM | POA: Insufficient documentation

## 2018-09-17 DIAGNOSIS — Z113 Encounter for screening for infections with a predominantly sexual mode of transmission: Secondary | ICD-10-CM

## 2018-09-17 LAB — POCT URINALYSIS DIP (PROADVANTAGE DEVICE)
Bilirubin, UA: NEGATIVE
Blood, UA: NEGATIVE
GLUCOSE UA: NEGATIVE mg/dL
Ketones, POC UA: NEGATIVE mg/dL
LEUKOCYTES UA: NEGATIVE
Nitrite, UA: NEGATIVE
Protein Ur, POC: NEGATIVE mg/dL
SPECIFIC GRAVITY, URINE: 1.03
UUROB: NEGATIVE
pH, UA: 5.5 (ref 5.0–8.0)

## 2018-09-17 NOTE — Progress Notes (Signed)
Subjective:   HPI  Zachary Morris is a 22 y.o. male who presents for a complete physical.  Doing well, no particular issues.  Concerns: Asthma -no recent problems  Allergies - more seasonal in past year  No recent headache issues  Reviewed their medical, surgical, family, social, medication, and allergy history and updated chart as appropriate.  Past Medical History:  Diagnosis Date  . Abnormal EKG 08/2010   Subsequent normal echocardiogram, [facility]  . Anemia   . Asthma   . H/O echocardiogram 08/2010   Normal, Dr. Cristy Folks, pediatric cardiology [facility]  . Low back pain   . Migraine 2015  . Perennial allergic rhinitis   . Wears glasses     Past Surgical History:  Procedure Laterality Date  . HERNIA REPAIR     umbilical    Social History   Socioeconomic History  . Marital status: Single    Spouse name: Not on file  . Number of children: Not on file  . Years of education: Not on file  . Highest education level: Not on file  Occupational History  . Not on file  Social Needs  . Financial resource strain: Not on file  . Food insecurity:    Worry: Not on file    Inability: Not on file  . Transportation needs:    Medical: Not on file    Non-medical: Not on file  Tobacco Use  . Smoking status: Never Smoker  . Smokeless tobacco: Never Used  Substance and Sexual Activity  . Alcohol use: No  . Drug use: No  . Sexual activity: Never  Lifestyle  . Physical activity:    Days per week: Not on file    Minutes per session: Not on file  . Stress: Not on file  Relationships  . Social connections:    Talks on phone: Not on file    Gets together: Not on file    Attends religious service: Not on file    Active member of club or organization: Not on file    Attends meetings of clubs or organizations: Not on file    Relationship status: Not on file  . Intimate partner violence:    Fear of current or ex partner: Not on file    Emotionally abused:  Not on file    Physically abused: Not on file    Forced sexual activity: Not on file  Other Topics Concern  . Not on file  Social History Narrative   Lives with mother and brother, International aid/development worker at [employer].  Exercise - 5 days per week, [employer], aerobic and weights.  No children.  Went to [employer].  [employer], considering athletic training as a Event organiser.  09/2018    Family History  Problem Relation Age of Onset  . Asthma Mother   . Hypertension Father   . Appendicitis Brother   . Hypertension Maternal Grandmother   . Arthritis Maternal Grandmother   . Diabetes Maternal Grandmother   . Hypertension Maternal Grandfather   . Arthritis Maternal Grandfather   . Cancer Maternal Aunt        cervical cancer     Current Outpatient Medications:  .  albuterol (PROVENTIL HFA;VENTOLIN HFA) 108 (90 Base) MCG/ACT inhaler, Inhale 2 puffs into the lungs every 6 (six) hours as needed., Disp: 1 Inhaler, Rfl: 2  No Known Allergies   Review of Systems Constitutional: -fever, -chills, -sweats, -unexpected weight change, -decreased appetite, -fatigue Allergy: -sneezing, -itching, -congestion  Dermatology: -changing moles, --rash, -lumps ENT: -runny nose, -ear pain, -sore throat, -hoarseness, -sinus pain, -teeth pain, - ringing in ears, -hearing loss, -nosebleeds Cardiology: -chest pain, -palpitations, -swelling, -difficulty breathing when lying flat, -waking up short of breath Respiratory: -cough, -shortness of breath, -difficulty breathing with exercise or exertion, -wheezing, -coughing up blood Gastroenterology: -abdominal pain, -nausea, -vomiting, -diarrhea, -constipation, -blood in stool, -changes in bowel movement, -difficulty swallowing or eating Hematology: -bleeding, -bruising  Musculoskeletal: -joint aches, -muscle aches, -joint swelling, -back pain, -neck pain, -cramping, -changes in gait Ophthalmology: denies vision changes, eye redness, itching, discharge Urology:  -burning with urination, -difficulty urinating, -blood in urine, -urinary frequency, -urgency, -incontinence Neurology: -headache, -weakness, -tingling, -numbness, -memory loss, -falls, -dizziness Psychology: -depressed mood, -agitation, -sleep problems     Objective:   Physical Exam  BP 120/72   Pulse 63   Temp 98.1 F (36.7 C) (Oral)   Resp 16   Ht 5' 5.25" (1.657 m)   Wt 175 lb 12.8 oz (79.7 kg)   SpO2 98%   BMI 29.03 kg/m   General appearance: alert, no distress, WD/WN, lean muscular AA male Skin: several tattoos, right lower anterior leg, upper abdomen, no worrisome lesions, tattoo right forearm anteriorly HEENT: normocephalic, conjunctiva/corneas normal, sclerae anicteric, PERRLA, EOMi, nares patent, no discharge or erythema, pharynx normal Oral cavity: MMM, tongue normal, teeth normal, no lesions Neck: supple, no lymphadenopathy, no thyromegaly, no masses, normal ROM, no bruits Chest: non tender, normal shape and expansion Heart: RRR, normal S1, S2, no murmurs Lungs: CTA bilaterally, no wheezes, rhonchi, or rales Abdomen: +bs, soft, non tender, non distended, no masses, no hepatomegaly, no splenomegaly, no bruits Back: non tender, normal ROM, no scoliosis Musculoskeletal: tender over left bicep tendon, upper extremities non tender, no obvious deformity, normal ROM throughout, lower extremities non tender, no obvious deformity, normal ROM throughout Extremities: no edema, no cyanosis, no clubbing Pulses: 2+ symmetric, upper and lower extremities, normal cap refill Neurological: alert, oriented x 3, CN2-12 intact, strength normal upper extremities and lower extremities, sensation normal throughout, DTRs 2+ throughout, no cerebellar signs, gait normal Psychiatric: normal affect, behavior normal, pleasant , but tearful talking about his mood concerns, loss GU: normal male external genitalia, circumcised, non tender, no masses, no hernia, no lymphadenopathy Rectal: deferred due  to age 22yo and no indication today    Assessment and Plan :    Encounter Diagnoses  Name Primary?  . Routine general medical examination at a health care facility Yes  . Screen for STD (sexually transmitted disease)   . Influenza vaccination declined     Physical exam - discussed healthy lifestyle, diet, exercise, preventative care, vaccinations, and addressed their concerns.   See your dentist yearly for routine dental care including hygiene visits twice yearly. See your eye doctor yearly for routine vision care.   Routine labs today.  STD labs, discussed safe sex, condom use.  Vaccinations: Recommended flu shot but he declines.   HPV up to date Td up to date  Other concerns today: Asthma -continue albuterol prn  Allergies - c/t OTC antihistamine prior to seasonal flares  History of migraines-no recent problems   Follow up  Pending labs  Jethro Bolusris was seen today for cpe.  Diagnoses and all orders for this visit:  Routine general medical examination at a health care facility -     POCT Urinalysis DIP (Proadvantage Device) -     Basic metabolic panel -     CBC -     HIV Antibody (  routine testing w rflx) -     RPR -     GC/Chlamydia Probe Amp  Screen for STD (sexually transmitted disease) -     HIV Antibody (routine testing w rflx) -     RPR -     GC/Chlamydia Probe Amp  Influenza vaccination declined

## 2018-09-18 LAB — CBC
HEMOGLOBIN: 14 g/dL (ref 13.0–17.7)
Hematocrit: 43 % (ref 37.5–51.0)
MCH: 26.7 pg (ref 26.6–33.0)
MCHC: 32.6 g/dL (ref 31.5–35.7)
MCV: 82 fL (ref 79–97)
Platelets: 202 10*3/uL (ref 150–450)
RBC: 5.24 x10E6/uL (ref 4.14–5.80)
RDW: 13.3 % (ref 12.3–15.4)
WBC: 4.8 10*3/uL (ref 3.4–10.8)

## 2018-09-18 LAB — HIV ANTIBODY (ROUTINE TESTING W REFLEX): HIV Screen 4th Generation wRfx: NONREACTIVE

## 2018-09-18 LAB — BASIC METABOLIC PANEL
BUN/Creatinine Ratio: 11 (ref 9–20)
BUN: 15 mg/dL (ref 6–20)
CHLORIDE: 103 mmol/L (ref 96–106)
CO2: 24 mmol/L (ref 20–29)
Calcium: 9.7 mg/dL (ref 8.7–10.2)
Creatinine, Ser: 1.31 mg/dL — ABNORMAL HIGH (ref 0.76–1.27)
GFR calc Af Amer: 88 mL/min/{1.73_m2} (ref >59)
GFR calc non Af Amer: 76 mL/min/{1.73_m2} (ref >59)
GLUCOSE: 68 mg/dL (ref 65–99)
Potassium: 4.4 mmol/L (ref 3.5–5.2)
SODIUM: 144 mmol/L (ref 134–144)

## 2018-09-18 LAB — RPR: RPR: NONREACTIVE

## 2018-09-19 LAB — GC/CHLAMYDIA PROBE AMP
Chlamydia trachomatis, NAA: NEGATIVE
Neisseria gonorrhoeae by PCR: NEGATIVE

## 2019-06-01 ENCOUNTER — Other Ambulatory Visit: Payer: Self-pay | Admitting: Medical

## 2019-06-01 DIAGNOSIS — J45990 Exercise induced bronchospasm: Secondary | ICD-10-CM

## 2019-06-01 MED ORDER — ALBUTEROL SULFATE HFA 108 (90 BASE) MCG/ACT IN AERS: 2.0000 | INHALATION_SPRAY | Freq: Four times a day (QID) | RESPIRATORY_TRACT | 1 refills | Status: DC | PRN

## 2019-11-19 ENCOUNTER — Other Ambulatory Visit: Payer: Self-pay

## 2019-11-19 ENCOUNTER — Encounter: Payer: Self-pay | Admitting: Medical

## 2019-11-19 ENCOUNTER — Ambulatory Visit (INDEPENDENT_AMBULATORY_CARE_PROVIDER_SITE_OTHER): Payer: Managed Care, Other (non HMO) | Admitting: Medical

## 2019-11-19 VITALS — BP 118/82 | HR 66 | Temp 98.2°F | Ht 65.0 in | Wt 182.0 lb

## 2019-11-19 DIAGNOSIS — Z9109 Other allergy status, other than to drugs and biological substances: Secondary | ICD-10-CM

## 2019-11-19 DIAGNOSIS — J45990 Exercise induced bronchospasm: Secondary | ICD-10-CM | POA: Diagnosis not present

## 2019-11-19 DIAGNOSIS — Z113 Encounter for screening for infections with a predominantly sexual mode of transmission: Secondary | ICD-10-CM

## 2019-11-19 DIAGNOSIS — Z8669 Personal history of other diseases of the nervous system and sense organs: Secondary | ICD-10-CM | POA: Diagnosis not present

## 2019-11-19 DIAGNOSIS — Z0000 Encounter for general adult medical examination without abnormal findings: Secondary | ICD-10-CM | POA: Diagnosis not present

## 2019-11-19 DIAGNOSIS — Z2821 Immunization not carried out because of patient refusal: Secondary | ICD-10-CM

## 2019-11-19 DIAGNOSIS — E739 Lactose intolerance, unspecified: Secondary | ICD-10-CM

## 2019-11-19 NOTE — Patient Instructions (Signed)
RESOURCES in [place], [place]  If you are experiencing a mental health crisis or an emergency, please call 911 or go to the nearest emergency department.  [facility]   [phone] [facility]  [phone] [facility]   [phone]  Suicide Hotline 1-800-Suicide 424-453-7798)  National Suicide Prevention Lifeline 212-057-8337  231-017-1807)  Domestic Violence, Rape/Crisis - [facility] [place] 638-453-6468  [employer] Violence Hotline 1-800-799-SAFE 7251230847)  To report Child or Elder Abuse, please call: [facility] Department  [phone] [facility] Department  [phone]  [facility] Crisis Line (502)[phone]  Teen Crisis line (216)454-3558 or 830-595-4559      Counseling Services (NON- psychiatrist offices) Dr. Nicole Cella, [place] [phone] [place], [place] [address]   [facility] [address], [place], [place] [address] [phone]   [facility] [phone] [address], [place], [place] [address]   [facility] [phone]  [email or web address] [address], [address] [place], [place], [place] [address]   Lenise Arena. Charlyne Mom, therapist [phone] [address] [place], [place] [address]   [facility] [phone] [address] [place], [place] [address]   Glade Lloyd, therapist [phone] [address], [place], [place] [address]   [facility] [phone] [address] [place], [place], [place] [address]

## 2019-11-19 NOTE — Progress Notes (Signed)
Subjective: Chief Complaint  Patient presents with  . Annual Exam    fasting labs   Here for physical.   Overall physical feeling fine.  Mentally has some concerns.    wants to discuss mood changes, mental health concerns.  For a while has had ups and downs, had anxiety, sometimes hinders from getting through the day.  We discussed mood back in 2016.  Just recently his mood has changed.    Feels down and depressed at time.  Hardly sleeps.  Has hard time getting to sleep. Always thinking about things, mind always going.  From time to time can go without sleep for more than 24 hours.  Sometimes has desired for enjoyable activities, other times endorses anhedonia.  Gets mood swings.   No hallucinations.  No delusions.   No alcohol, no drugs.  No significant other.   Last relationship with dating was in high school.  Working 40 hours per week. Work is going ok, no disciplinary issues, works fine.  Its a job, not a career, but no problems at work.    Thinks mother has depression but she has never voiced a diagnosis of depression.  Half sister is bipolar.   Brother has been diagnosed with depression.  Thinks someone on mother's side was schizophrenia.     Last year brother attempted suicide taking a bunch of pills.  Yerik took his brother to the hospital.   Feeling like a failure, should be in better position with career, not happy with current position.   Feels like he should be sociable but doesn't trust people.    Has tried dating in the past year, but after a short dating period, the other persons have commented about his anger, or being too clingy, not good communication.   Has not seen a Veterinary surgeon.  Mom is like his best friend.   Lives with mom and brother.   He sees his father, but his father chooses when he wants to be around and involved.   No SI/HI.  Takes frustration out with either silence, going to the gym or listening to music.  Reviewed their medical, surgical, family, social,  medication, and allergy history and updated chart as appropriate.  Past Medical History:  Diagnosis Date  . Abnormal EKG 08/2010   Subsequent normal echocardiogram, [facility]  . Anemia    prior, normal CBC 2020  . Asthma   . H/O echocardiogram 08/2010   Normal, Dr. Cristy Folks, pediatric cardiology [facility]  . Low back pain   . Migraine 2015  . Perennial allergic rhinitis   . Wears glasses     Past Surgical History:  Procedure Laterality Date  . HERNIA REPAIR     umbilical    Social History   Socioeconomic History  . Marital status: Single    Spouse name: Not on file  . Number of children: Not on file  . Years of education: Not on file  . Highest education level: Not on file  Occupational History  . Not on file  Tobacco Use  . Smoking status: Never Smoker  . Smokeless tobacco: Never Used  Substance and Sexual Activity  . Alcohol use: No  . Drug use: No  . Sexual activity: Never  Other Topics Concern  . Not on file  Social History Narrative   Lives with mother and brother, working at [employer], packing and shipping.  Exercise - 5 days per week, [employer], aerobic and weights.  No children.  Went to  [employer].  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  11/2019   Social Determinants of Health   Financial Resource Strain:   . Difficulty of Paying Living Expenses: Not on file  Food Insecurity:   . Worried About Charity fundraiser in the Last Year: Not on file  . Ran Out of Food in the Last Year: Not on file  Transportation Needs:   . Lack of Transportation (Medical): Not on file  . Lack of Transportation (Non-Medical): Not on file  Physical Activity:   . Days of Exercise per Week: Not on file  . Minutes of Exercise per Session: Not on file  Stress:   . Feeling of Stress : Not on file  Social Connections:   . Frequency of Communication with Friends and Family: Not on file  . Frequency of Social Gatherings with Friends  and Family: Not on file  . Attends Religious Services: Not on file  . Active Member of Clubs or Organizations: Not on file  . Attends Archivist Meetings: Not on file  . Marital Status: Not on file  Intimate Partner Violence:   . Fear of Current or Ex-Partner: Not on file  . Emotionally Abused: Not on file  . Physically Abused: Not on file  . Sexually Abused: Not on file    Family History  Problem Relation Age of Onset  . Asthma Mother   . Hypertension Father   . Appendicitis Brother   . Hypertension Maternal Grandmother   . Arthritis Maternal Grandmother   . Diabetes Maternal Grandmother   . Hypertension Maternal Grandfather   . Arthritis Maternal Grandfather   . Cancer Maternal Aunt        cervical cancer     Current Outpatient Medications:  .  albuterol (VENTOLIN HFA) 108 (90 Base) MCG/ACT inhaler, Inhale 2 puffs into the lungs every 6 (six) hours as needed., Disp: 18 g, Rfl: 1  No Known Allergies     Review of Systems Constitutional: -fever, -chills, -sweats, -unexpected weight change, -decreased appetite, -fatigue Allergy: -sneezing, -itching, -congestion Dermatology: -changing moles, --rash, -lumps ENT: -runny nose, -ear pain, -sore throat, -hoarseness, -sinus pain, -teeth pain, - ringing in ears, -hearing loss, -nosebleeds Cardiology: -chest pain, -palpitations, -swelling, -difficulty breathing when lying flat, -waking up short of breath Respiratory: -cough, -shortness of breath, -difficulty breathing with exercise or exertion, -wheezing, -coughing up blood Gastroenterology: -abdominal pain, -nausea, -vomiting, -diarrhea, -constipation, -blood in stool, -changes in bowel movement, -difficulty swallowing or eating Hematology: -bleeding, -bruising  Musculoskeletal: -joint aches, -muscle aches, -joint swelling, -back pain, -neck pain, -cramping, -changes in gait Ophthalmology: denies vision changes, eye redness, itching, discharge Urology: -burning with  urination, -difficulty urinating, -blood in urine, -urinary frequency, -urgency, -incontinence Neurology: -headache, -weakness, -tingling, -numbness, -memory loss, -falls, -dizziness Psychology:+depressed mood, -agitation, +sleep problems Male GU: no testicular mass, pain, no lymph nodes swollen, no swelling, no rash.     Objective:  BP 118/82   Pulse 66   Temp 98.2 F (36.8 C)   Ht 5\' 5"  (1.651 m)   Wt 182 lb (82.6 kg)   SpO2 97%   BMI 30.29 kg/m   General appearance: alert, no distress, WD/WN, African American male Skin: tattoo chest, no other worrisome lesions Neck: supple, no lymphadenopathy, no thyromegaly, no masses, normal ROM, no bruits Chest: non tender, normal shape and expansion Heart: RRR, normal S1, S2, no murmurs Lungs: CTA bilaterally, no wheezes, rhonchi, or rales Abdomen: +bs, soft, non tender, non distended,  no masses, no hepatomegaly, no splenomegaly, no bruits Back: non tender, normal ROM, no scoliosis Musculoskeletal: upper extremities non tender, no obvious deformity, normal ROM throughout, lower extremities non tender, no obvious deformity, normal ROM throughout Extremities: no edema, no cyanosis, no clubbing Pulses: 2+ symmetric, upper and lower extremities, normal cap refill Neurological: alert, oriented x 3, CN2-12 intact, strength normal upper extremities and lower extremities, sensation normal throughout, DTRs 2+ throughout, no cerebellar signs, gait normal Psychiatric: normal affect, behavior normal, pleasant  GU: normal male external genitalia,circumcised, nontender, no masses, no hernia, no lymphadenopathy Rectal: deferred   Assessment and Plan :   Encounter Diagnoses  Name Primary?  . Routine general medical examination at a health care facility Yes  . Exercise-induced asthma   . Environmental allergies   . History of migraine   . Lactose intolerance   . Screen for STD (sexually transmitted disease)   . Influenza vaccination declined      Physical exam - discussed and counseled on healthy lifestyle, diet, exercise, preventative care, vaccinations, sick and well care, proper use of emergency dept and after hours care, and addressed their concerns.    Health screening: See your eye doctor yearly for routine vision care. See your dentist yearly for routine dental care including hygiene visits twice yearly.  Discussed STD testing, discussed prevention, condom use, means of transmission  Cancer screening Advised monthly self testicular exam   Vaccinations: Advised yearly influenza vaccine Up to date on Td vaccine    Separate significant chronic issues discussed: Mood change, depressed mood - we spent most of the visit on this issue today.  I encouraged him to reapply and be persistent with pursuing law enforcement academy.  advised counseling and gave list of counselors to call for appt.  C/t health diet and exercise.   Counseled on his concerns, having purpose, working on his mental health with counseling, job pursuits, and try to encourage him to work towards personal and career goals   [place] was seen today for annual exam.  Diagnoses and all orders for this visit:  Routine general medical examination at a health care facility -     Basic metabolic panel -     CBC -     HIV Antibody (routine testing w rflx) -     RPR -     GC/Chlamydia Probe Amp  Exercise-induced asthma  Environmental allergies  History of migraine  Lactose intolerance  Screen for STD (sexually transmitted disease) -     HIV Antibody (routine testing w rflx) -     RPR -     GC/Chlamydia Probe Amp  Influenza vaccination declined    Follow-up pending labs, yearly for physical

## 2019-11-20 LAB — BASIC METABOLIC PANEL
BUN/Creatinine Ratio: 11 (ref 9–20)
BUN: 13 mg/dL (ref 6–20)
CO2: 25 mmol/L (ref 20–29)
Calcium: 9.9 mg/dL (ref 8.7–10.2)
Chloride: 102 mmol/L (ref 96–106)
Creatinine, Ser: 1.2 mg/dL (ref 0.76–1.27)
GFR calc Af Amer: 97 mL/min/{1.73_m2} (ref >59)
GFR calc non Af Amer: 84 mL/min/{1.73_m2} (ref >59)
Glucose: 70 mg/dL (ref 65–99)
Potassium: 4.9 mmol/L (ref 3.5–5.2)
Sodium: 141 mmol/L (ref 134–144)

## 2019-11-20 LAB — CBC
Hematocrit: 47.3 % (ref 37.5–51.0)
Hemoglobin: 15.4 g/dL (ref 13.0–17.7)
MCH: 26.4 pg — ABNORMAL LOW (ref 26.6–33.0)
MCHC: 32.6 g/dL (ref 31.5–35.7)
MCV: 81 fL (ref 79–97)
Platelets: 261 10*3/uL (ref 150–450)
RBC: 5.83 x10E6/uL — ABNORMAL HIGH (ref 4.14–5.80)
RDW: 13.1 % (ref 11.6–15.4)
WBC: 5.8 10*3/uL (ref 3.4–10.8)

## 2019-11-20 LAB — HIV ANTIBODY (ROUTINE TESTING W REFLEX): HIV Screen 4th Generation wRfx: NONREACTIVE

## 2019-11-20 LAB — RPR: RPR Ser Ql: NONREACTIVE

## 2019-11-21 LAB — GC/CHLAMYDIA PROBE AMP
Chlamydia trachomatis, NAA: NEGATIVE
Neisseria Gonorrhoeae by PCR: NEGATIVE

## 2020-02-15 ENCOUNTER — Ambulatory Visit (INDEPENDENT_AMBULATORY_CARE_PROVIDER_SITE_OTHER): Payer: 59 | Admitting: Medical

## 2020-02-15 ENCOUNTER — Encounter: Payer: Self-pay | Admitting: Medical

## 2020-02-15 ENCOUNTER — Other Ambulatory Visit: Payer: Self-pay

## 2020-02-15 VITALS — BP 110/80 | HR 80 | Temp 98.7°F | Ht 65.0 in | Wt 184.6 lb

## 2020-02-15 DIAGNOSIS — I889 Nonspecific lymphadenitis, unspecified: Secondary | ICD-10-CM | POA: Insufficient documentation

## 2020-02-15 DIAGNOSIS — K5900 Constipation, unspecified: Secondary | ICD-10-CM

## 2020-02-15 DIAGNOSIS — R300 Dysuria: Secondary | ICD-10-CM | POA: Diagnosis not present

## 2020-02-15 DIAGNOSIS — Z202 Contact with and (suspected) exposure to infections with a predominantly sexual mode of transmission: Secondary | ICD-10-CM | POA: Insufficient documentation

## 2020-02-15 LAB — POCT URINALYSIS DIP (PROADVANTAGE DEVICE)
Bilirubin, UA: NEGATIVE
Blood, UA: NEGATIVE
Glucose, UA: NEGATIVE mg/dL
Ketones, POC UA: NEGATIVE mg/dL
Leukocytes, UA: NEGATIVE
Nitrite, UA: NEGATIVE
Protein Ur, POC: NEGATIVE mg/dL
Specific Gravity, Urine: 1.015
Urobilinogen, Ur: NEGATIVE
pH, UA: 6 (ref 5.0–8.0)

## 2020-02-15 NOTE — Addendum Note (Signed)
Addended by: Victorio Palm on: 02/15/2020 03:27 PM   Modules accepted: Orders

## 2020-02-15 NOTE — Progress Notes (Signed)
Subjective: Chief Complaint  Patient presents with  . Other    burning with urination   Here for burning with urination x 2 days.  No polyuria, no blood in urine, no penile discharge, no pain or swelling of testicles.   No redness or rash.  Has new sexual partner x 1 week.   Used condoms but not with oral sex.   The partner stated she thought she was getting a cold sore.   They denied other STD.     He notes constipation in general, has 1 BM about 3 days per week, harder dry stool.  He thinks he probably needs to drink more water.  No other aggravating or relieving factors.    No other c/o.  The following portions of the patient's history were reviewed and updated as appropriate: allergies, current medications, past family history, past medical history, past social history, past surgical history and problem list.  ROS Otherwise as in subjective above    Objective: BP 110/80   Pulse 80   Temp 98.7 F (37.1 C)   Ht 5\' 5"  (1.651 m)   Wt 184 lb 9.6 oz (83.7 kg)   SpO2 98%   BMI 30.72 kg/m   General appearance: alert, no distress, well developed, well nourished African American male Abdomen: +bs, soft, mild RLQ tenderness, otherwise non tender, non distended, no masses, no hepatomegaly, no splenomegaly GU: normal male GU, circumcised, no hernia, no mass, but tender shoddy bilat inguinal nodes Pulses: 2+ radial pulses, 2+ pedal pulses, normal cap refill Ext: no edema    Assessment: Encounter Diagnoses  Name Primary?  . Dysuria Yes  . Lymphadenitis   . Venereal disease contact   . Constipation, unspecified constipation type      Plan: Discussed symptoms, concerns, recent sexual contact.  advised condom use.   Labs today.  Advised repeat labs 45mo.    Advised less caffeine use in general  constipation - discussed importance of water and fiber intake  Zachary Morris was seen today for other.  Diagnoses and all orders for this visit:  Dysuria -     HIV Antibody (routine testing  w rflx) -     RPR -     Chlamydia/Gonococcus/Trichomonas, NAA -     HSV(herpes simplex vrs) 1+2 ab-IgM -     HSV 1 antibody, IgG -     HSV 2 antibody, IgG  Lymphadenitis -     HIV Antibody (routine testing w rflx) -     RPR -     Chlamydia/Gonococcus/Trichomonas, NAA -     HSV(herpes simplex vrs) 1+2 ab-IgM -     HSV 1 antibody, IgG -     HSV 2 antibody, IgG  Venereal disease contact -     HIV Antibody (routine testing w rflx) -     RPR -     Chlamydia/Gonococcus/Trichomonas, NAA -     HSV(herpes simplex vrs) 1+2 ab-IgM -     HSV 1 antibody, IgG -     HSV 2 antibody, IgG  Constipation, unspecified constipation type    Follow up: pending labs, repeat testing 17mo

## 2020-02-16 LAB — HSV-2 IGG SUPPLEMENTAL TEST: HSV-2 IgG Supplemental Test: NEGATIVE

## 2020-02-16 LAB — HSV 1 ANTIBODY, IGG: HSV 1 Glycoprotein G Ab, IgG: <0.91 index (ref 0.00–0.90)

## 2020-02-16 LAB — HSV(HERPES SIMPLEX VRS) I + II AB-IGM: HSVI/II Comb IgM: <0.91 Ratio (ref 0.00–0.90)

## 2020-02-16 LAB — CHLAMYDIA/GONOCOCCUS/TRICHOMONAS, NAA
Chlamydia by NAA: NEGATIVE
Gonococcus by NAA: NEGATIVE
Trich vag by NAA: NEGATIVE

## 2020-02-16 LAB — HSV 2 ANTIBODY, IGG: HSV 2 IgG, Type Spec: 1.87 index — ABNORMAL HIGH (ref 0.00–0.90)

## 2020-02-16 LAB — HIV ANTIBODY (ROUTINE TESTING W REFLEX): HIV Screen 4th Generation wRfx: NONREACTIVE

## 2020-02-16 LAB — RPR: RPR Ser Ql: NONREACTIVE

## 2020-04-27 ENCOUNTER — Other Ambulatory Visit: Payer: Self-pay | Admitting: Medical

## 2020-04-27 DIAGNOSIS — J45990 Exercise induced bronchospasm: Secondary | ICD-10-CM

## 2020-04-27 MED ORDER — ALBUTEROL SULFATE HFA 108 (90 BASE) MCG/ACT IN AERS: 2.0000 | INHALATION_SPRAY | Freq: Four times a day (QID) | RESPIRATORY_TRACT | 1 refills | Status: DC | PRN

## 2020-08-10 ENCOUNTER — Encounter: Payer: Self-pay | Admitting: Medical

## 2020-08-10 ENCOUNTER — Other Ambulatory Visit: Payer: Self-pay

## 2020-08-10 ENCOUNTER — Ambulatory Visit (INDEPENDENT_AMBULATORY_CARE_PROVIDER_SITE_OTHER): Payer: 59 | Admitting: Medical

## 2020-08-10 VITALS — BP 134/76 | HR 88 | Ht 65.0 in | Wt 173.6 lb

## 2020-08-10 DIAGNOSIS — F40241 Acrophobia: Secondary | ICD-10-CM | POA: Diagnosis not present

## 2020-08-10 DIAGNOSIS — J4520 Mild intermittent asthma, uncomplicated: Secondary | ICD-10-CM | POA: Diagnosis not present

## 2020-08-10 DIAGNOSIS — F410 Panic disorder [episodic paroxysmal anxiety] without agoraphobia: Secondary | ICD-10-CM

## 2020-08-10 NOTE — Progress Notes (Signed)
Subjective: Chief Complaint  Patient presents with  . Consult    needs form filled due to afraid of heights    Here for concern about panic attack, fear of heights.   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  Recently they asked him to do some cross training at work.  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   He is really afraid of heights and yesterday this all came to a head.   Was trying to do the training, fellow employees trying to help him through the training.  Burgess Estelle was his first solo day using the lift.  Not long into his shift he had a panic attack.  Felt feeling of claustrophobia, breathing fast, anxiety came over him, heart beating fast.   He got the machine lowered, but called for help.  Employer took him to local clinic for eval.  He notes that he was diagnosed with panic attack and asthma attack.   Monitored for a while then released him.  After that visit talked to human resources.   They advised that he has 2 options - he can work through the anxiety until employer comes up with accommodations, or he can have his PCP complete form for accommodations.     He feels fully able to do all the other aspects of his job on the ground level, picking items, packaging items.  He does is unable to climb ladders, use lift devices, harness in on a lift device due to his phobia.  Prior to cross training, was a Field seismologist.   He notes problems with heights even from childhood.    Can't swim.  Has fear of heights and water.       Past Medical History:  Diagnosis Date  . Abnormal EKG 08/2010   Subsequent normal echocardiogram, [facility]  . Anemia    prior, normal CBC 2020  . Asthma   . H/O echocardiogram 08/2010   Normal, Dr. Cristy Folks, pediatric cardiology [facility]  . Low back pain   . Migraine 2015  . Perennial allergic rhinitis   . Wears glasses     Current Outpatient Medications on File Prior to Visit  Medication Sig Dispense Refill  . albuterol (VENTOLIN HFA) 108 (90 Base) MCG/ACT inhaler Inhale 2 puffs into the lungs every 6 (six) hours as needed. 18 g 1   No current facility-administered medications on file prior to visit.   ROS as in subjective   Objective: BP 134/76   Pulse 88   Ht 5\' 5"  (1.651 m)   Wt 173 lb 9.6 oz (78.7 kg)   SpO2 96%   BMI 28.89 kg/m   General: Well-developed well-nourished no acute stress Psych: Pleasant, answers question appropriately, good eye contact MSK: Unremarkable, normal range of motion of upper and lower extremities, no swelling or deformity Back nontender, no deformity, normal range of motion Neuro: CN II through XII intact, nonfocal exam    Assessment: Encounter Diagnoses  Name Primary?  . Panic attack Yes  . Fear of heights   . 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 Mild intermittent asthma without complication      Plan We discussed his fear and phobias, the recent panic attacks including 1 at work.  I wrote accommodation for him on the form he brought in from work that asked that he not use any type  of device that would require him to be up in the air such as power lift, symptoms liver, bladder, or other device due to his extreme fear of heights.  Physically he is certainly capable of doing any of the job requested him on his current job at the ground-level.  Domingo was seen today for consult.  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

## 2020-08-15 ENCOUNTER — Telehealth: Payer: Self-pay | Admitting: Medical

## 2020-08-15 NOTE — Telephone Encounter (Signed)
Form has been placed in your green folder on your desk.

## 2020-08-15 NOTE — Telephone Encounter (Signed)
Pt came in and dropped off an additional form his work is requesting. Sending back to [facility] per office protocol. Please call pt when ready at [phone].

## 2020-08-16 NOTE — Telephone Encounter (Signed)
Patient has been contacted to pick up form. He will pick up form tomorrow. Copy of form has already been made.

## 2020-08-16 NOTE — Telephone Encounter (Signed)
See form

## 2020-11-24 ENCOUNTER — Ambulatory Visit (INDEPENDENT_AMBULATORY_CARE_PROVIDER_SITE_OTHER): Payer: 59 | Admitting: Medical

## 2020-11-24 ENCOUNTER — Encounter: Payer: Self-pay | Admitting: Medical

## 2020-11-24 ENCOUNTER — Other Ambulatory Visit: Payer: Self-pay

## 2020-11-24 VITALS — BP 110/82 | HR 69 | Ht 65.0 in | Wt 179.8 lb

## 2020-11-24 DIAGNOSIS — F40241 Acrophobia: Secondary | ICD-10-CM

## 2020-11-24 DIAGNOSIS — K5900 Constipation, unspecified: Secondary | ICD-10-CM

## 2020-11-24 DIAGNOSIS — Z8669 Personal history of other diseases of the nervous system and sense organs: Secondary | ICD-10-CM

## 2020-11-24 DIAGNOSIS — J45990 Exercise induced bronchospasm: Secondary | ICD-10-CM

## 2020-11-24 DIAGNOSIS — Z0000 Encounter for general adult medical examination without abnormal findings: Secondary | ICD-10-CM

## 2020-11-24 DIAGNOSIS — Z2821 Immunization not carried out because of patient refusal: Secondary | ICD-10-CM

## 2020-11-24 DIAGNOSIS — Z113 Encounter for screening for infections with a predominantly sexual mode of transmission: Secondary | ICD-10-CM | POA: Diagnosis not present

## 2020-11-24 DIAGNOSIS — J4520 Mild intermittent asthma, uncomplicated: Secondary | ICD-10-CM

## 2020-11-24 DIAGNOSIS — E739 Lactose intolerance, unspecified: Secondary | ICD-10-CM

## 2020-11-24 DIAGNOSIS — Z9109 Other allergy status, other than to drugs and biological substances: Secondary | ICD-10-CM

## 2020-11-24 MED ORDER — ALBUTEROL SULFATE HFA 108 (90 BASE) MCG/ACT IN AERS: 2.0000 | INHALATION_SPRAY | Freq: Four times a day (QID) | RESPIRATORY_TRACT | 5 refills | Status: DC | PRN

## 2020-11-24 NOTE — Progress Notes (Signed)
Subjective:   HPI  Zachary Morris is a 25 y.o. male who presents for Chief Complaint  Patient presents with  . Annual Exam    Physical with fasting labs     Patient Care Team: Thor Nannini, Kermit Balo, PA-C as PCP - General (Family Medicine) Sees dentist Sees eye doctor  Concerns: Doing fine  Exercise induced asthma - uses albuterol every other day for prevention.  No recent wheezing, SOB or problems.  Declines preventative inhaler daily.  Reviewed their medical, surgical, family, social, medication, and allergy history and updated chart as appropriate.  Past Medical History:  Diagnosis Date  . Abnormal EKG 08/2010   Subsequent normal echocardiogram, [facility]  . Asthma   . H/O echocardiogram 08/2010   Normal, Dr. Cristy Folks, pediatric cardiology [facility]  . Low back pain   . Migraine 2015  . Perennial allergic rhinitis   . Wears glasses     Past Surgical History:  Procedure Laterality Date  . HERNIA REPAIR     umbilical    Family History  Problem Relation Age of Onset  . Asthma Mother   . Hypertension Father   . Appendicitis Brother   . Hypertension Maternal Grandmother   . Arthritis Maternal Grandmother   . Diabetes Maternal Grandmother   . Hypertension Maternal Grandfather   . Arthritis Maternal Grandfather   . Cancer Maternal Aunt        cervical cancer  . Heart disease Neg Hx      Current Outpatient Medications:  .  albuterol (VENTOLIN HFA) 108 (90 Base) MCG/ACT inhaler, Inhale 2 puffs into the lungs every 6 (six) hours as needed., Disp: 18 g, Rfl: 1  No Known Allergies     Review of Systems Constitutional: -fever, -chills, -sweats, -unexpected weight change, -decreased appetite, -fatigue Allergy: -sneezing, -itching, -congestion Dermatology: -changing moles, --rash, -lumps ENT: -runny nose, -ear pain, -sore throat, -hoarseness, -sinus pain, -teeth pain, - ringing in ears, -hearing loss, -nosebleeds Cardiology: -chest pain,  -palpitations, -swelling, -difficulty breathing when lying flat, -waking up short of breath Respiratory: -cough, -shortness of breath, -difficulty breathing with exercise or exertion, -wheezing, -coughing up blood Gastroenterology: -abdominal pain, -nausea, -vomiting, -diarrhea, -constipation, -blood in stool, -changes in bowel movement, -difficulty swallowing or eating Hematology: -bleeding, -bruising  Musculoskeletal: -joint aches, -muscle aches, -joint swelling, -back pain, -neck pain, -cramping, -changes in gait Ophthalmology: denies vision changes, eye redness, itching, discharge Urology: -burning with urination, -difficulty urinating, -blood in urine, -urinary frequency, -urgency, -incontinence Neurology: -headache, -weakness, -tingling, -numbness, -memory loss, -falls, -dizziness Psychology: -depressed mood, -agitation, -sleep problems Male GU: no testicular mass, pain, no lymph nodes swollen, no swelling, no rash.     Objective:  BP 110/82   Pulse 69   Ht 5\' 5"  (1.651 m)   Wt 179 lb 12.8 oz (81.6 kg)   SpO2 98%   BMI 29.92 kg/m   Wt Readings from Last 3 Encounters:  11/24/20 179 lb 12.8 oz (81.6 kg)  08/10/20 173 lb 9.6 oz (78.7 kg)  02/15/20 184 lb 9.6 oz (83.7 kg)    General appearance: alert, no distress, WD/WN, African American male muscular Skin: tattoos right anterior lower leg HEENT: normocephalic, conjunctiva/corneas normal, sclerae anicteric, PERRLA, EOMi, nares patent, no discharge or erythema, pharynx normal Oral cavity: MMM, tongue normal, teeth normal Neck: supple, no lymphadenopathy, no thyromegaly, no masses, normal ROM, no bruits Chest: non tender, normal shape and expansion Heart: RRR, normal S1, S2, no murmurs Lungs: CTA bilaterally, no wheezes, rhonchi, or  rales Abdomen: +bs, surgical scar inferior umbilicus,  soft, non tender, non distended, no masses, no hepatomegaly, no splenomegaly, no bruits Back: non tender, normal ROM, no  scoliosis Musculoskeletal: upper extremities non tender, no obvious deformity, normal ROM throughout, lower extremities non tender, no obvious deformity, normal ROM throughout Extremities: no edema, no cyanosis, no clubbing Pulses: 2+ symmetric, upper and lower extremities, normal cap refill Neurological: alert, oriented x 3, CN2-12 intact, strength normal upper extremities and lower extremities, sensation normal throughout, DTRs 2+ throughout, no cerebellar signs, gait normal Psychiatric: normal affect, behavior normal, pleasant  GU: normal male external genitalia,circumcised, nontender, no masses, no hernia, no lymphadenopathy Rectal: deferred    Assessment and Plan :   Encounter Diagnoses  Name Primary?  . Routine general medical examination at a health care facility Yes  . Screen for STD (sexually transmitted disease)   . 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 Mild intermittent asthma without complication   . Lactose intolerance   . Influenza vaccination declined   . History of migraine   . Fear of heights   . Exercise-induced asthma   . Environmental allergies   . Constipation, unspecified constipation type     Today you had a preventative care visit or wellness visit.    Topics today may have included healthy lifestyle, diet, exercise, preventative care, vaccinations, sick and well care, proper use of emergency dept and after hours care, as well as other concerns.     Recommendations: Continue to return yearly for your annual wellness and preventative care visits.  This gives [place] a chance to discuss healthy lifestyle, exercise, vaccinations, review your chart record, and perform screenings where appropriate.  I recommend you see your eye doctor yearly for routine vision care.  I recommend you see your dentist yearly for routine dental care including hygiene visits twice yearly.    Vaccination recommendations were reviewed  Declines flu and covid vaccines You are up to date on Tetanus  booster You have completed the HPV vaccine series prior   Screening for cancer: Colon cancer screening:  Age 45yo  Cancer screening You should do a monthly self testicular exam   We discussed PSA, prostate exam, and prostate cancer screening risks/benefits.   Age 60yo  Skin cancer screening: Check your skin regularly for new changes, growing lesions, or other lesions of concern Come in for evaluation if you have skin lesions of concern.  Lung cancer screening: If you have a greater than 30 pack year history of tobacco use, then you qualify for lung cancer screening with a chest CT scan  We currently don't have screenings for other cancers besides breast, cervical, colon, and lung cancers.  If you have a strong family history of cancer or have other cancer screening concerns, please let me know.    Bone health: Get at least 150 minutes of aerobic exercise weekly Get weight bearing exercise at least once weekly   Heart health: Get at least 150 minutes of aerobic exercise weekly Limit alcohol It is important to maintain a healthy blood pressure and healthy cholesterol numbers   Screening for sexually transmitted infections: We discussed testing, prevention, and means of transmission    Separate significant issues discussed: Asthma, exercise induced - doing fine on albuterol preventatively  Hx/o migraines - none in a while  Fear of heights - doing fine at work, avoiding ladders and heights.  Constipation - some recent constipation issues.  counseled on increased water and fiber intake   Orvan was seen  today for annual exam.  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

## 2020-11-25 LAB — COMPREHENSIVE METABOLIC PANEL
ALT: 15 IU/L (ref 0–44)
AST: 20 IU/L (ref 0–40)
Albumin/Globulin Ratio: 2.1 (ref 1.2–2.2)
Albumin: 4.9 g/dL (ref 4.1–5.2)
Alkaline Phosphatase: 52 IU/L (ref 44–121)
BUN/Creatinine Ratio: 12 (ref 9–20)
BUN: 14 mg/dL (ref 6–20)
Bilirubin Total: 0.5 mg/dL (ref 0.0–1.2)
CO2: 24 mmol/L (ref 20–29)
Calcium: 9.9 mg/dL (ref 8.7–10.2)
Chloride: 102 mmol/L (ref 96–106)
Creatinine, Ser: 1.2 mg/dL (ref 0.76–1.27)
GFR calc Af Amer: 97 mL/min/{1.73_m2} (ref >59)
GFR calc non Af Amer: 84 mL/min/{1.73_m2} (ref >59)
Globulin, Total: 2.3 g/dL (ref 1.5–4.5)
Glucose: 87 mg/dL (ref 65–99)
Potassium: 5.4 mmol/L — ABNORMAL HIGH (ref 3.5–5.2)
Sodium: 142 mmol/L (ref 134–144)
Total Protein: 7.2 g/dL (ref 6.0–8.5)

## 2020-11-25 LAB — LIPID PANEL
Chol/HDL Ratio: 3.5 ratio (ref 0.0–5.0)
Cholesterol, Total: 179 mg/dL (ref 100–199)
HDL: 51 mg/dL (ref >39)
LDL Chol Calc (NIH): 113 mg/dL — ABNORMAL HIGH (ref 0–99)
Triglycerides: 79 mg/dL (ref 0–149)
VLDL Cholesterol Cal: 15 mg/dL (ref 5–40)

## 2020-11-25 LAB — HIV ANTIBODY (ROUTINE TESTING W REFLEX): HIV Screen 4th Generation wRfx: NONREACTIVE

## 2020-11-25 LAB — CBC
Hematocrit: 45.2 % (ref 37.5–51.0)
Hemoglobin: 14.9 g/dL (ref 13.0–17.7)
MCH: 26.4 pg — ABNORMAL LOW (ref 26.6–33.0)
MCHC: 33 g/dL (ref 31.5–35.7)
MCV: 80 fL (ref 79–97)
Platelets: 219 10*3/uL (ref 150–450)
RBC: 5.64 x10E6/uL (ref 4.14–5.80)
RDW: 13.1 % (ref 11.6–15.4)
WBC: 4.6 10*3/uL (ref 3.4–10.8)

## 2020-11-25 LAB — RPR: RPR Ser Ql: NONREACTIVE

## 2020-11-26 LAB — GC/CHLAMYDIA PROBE AMP
Chlamydia trachomatis, NAA: NEGATIVE
Neisseria Gonorrhoeae by PCR: NEGATIVE

## 2021-06-27 ENCOUNTER — Other Ambulatory Visit: Payer: Self-pay | Admitting: Medical

## 2021-06-27 DIAGNOSIS — J45990 Exercise induced bronchospasm: Secondary | ICD-10-CM

## 2021-06-27 NOTE — Telephone Encounter (Signed)
Pt has an appt in january 

## 2021-07-03 ENCOUNTER — Other Ambulatory Visit: Payer: Self-pay | Admitting: Medical

## 2021-07-03 MED ORDER — ALBUTEROL SULFATE 108 (90 BASE) MCG/ACT IN AEPB: 2.0000 | INHALATION_SPRAY | RESPIRATORY_TRACT | 1 refills | Status: DC | PRN

## 2021-07-28 ENCOUNTER — Other Ambulatory Visit: Payer: Self-pay | Admitting: Medical

## 2021-07-28 DIAGNOSIS — J45990 Exercise induced bronchospasm: Secondary | ICD-10-CM

## 2021-10-25 ENCOUNTER — Encounter: Payer: Self-pay | Admitting: Internal Medicine

## 2021-11-01 NOTE — Telephone Encounter (Signed)
Left message for pt to call back about status of flu shot °

## 2021-11-30 ENCOUNTER — Encounter: Payer: 59 | Admitting: Medical

## 2022-02-02 ENCOUNTER — Encounter: Payer: Self-pay | Admitting: Medical

## 2022-02-02 ENCOUNTER — Ambulatory Visit: Payer: No Typology Code available for payment source | Admitting: Medical

## 2022-02-02 ENCOUNTER — Other Ambulatory Visit: Payer: Self-pay | Admitting: Medical

## 2022-02-02 ENCOUNTER — Telehealth: Payer: Self-pay | Admitting: Medical

## 2022-02-02 VITALS — BP 120/80 | HR 84 | Ht 66.0 in | Wt 177.2 lb

## 2022-02-02 DIAGNOSIS — Z0000 Encounter for general adult medical examination without abnormal findings: Secondary | ICD-10-CM | POA: Diagnosis not present

## 2022-02-02 DIAGNOSIS — G43909 Migraine, unspecified, not intractable, without status migrainosus: Secondary | ICD-10-CM

## 2022-02-02 DIAGNOSIS — J3089 Other allergic rhinitis: Secondary | ICD-10-CM

## 2022-02-02 DIAGNOSIS — Z113 Encounter for screening for infections with a predominantly sexual mode of transmission: Secondary | ICD-10-CM | POA: Diagnosis not present

## 2022-02-02 DIAGNOSIS — J4520 Mild intermittent asthma, uncomplicated: Secondary | ICD-10-CM

## 2022-02-02 MED ORDER — LEVALBUTEROL TARTRATE 45 MCG/ACT IN AERO
2.0000 | INHALATION_SPRAY | Freq: Three times a day (TID) | RESPIRATORY_TRACT | 2 refills | Status: DC | PRN
Start: 2022-02-02 — End: 2022-02-02

## 2022-02-02 NOTE — Patient Instructions (Signed)
Health Maintenance, Male Adopting a healthy lifestyle and getting preventive care are important in promoting health and wellness. Ask your health care provider about: The right schedule for you to have regular tests and exams. Things you can do on your own to prevent diseases and keep yourself healthy. What should I know about diet, weight, and exercise? Eat a healthy diet  Eat a diet that includes plenty of vegetables, fruits, low-fat dairy products, and lean protein. Do not eat a lot of foods that are high in solid fats, added sugars, or sodium. Maintain a healthy weight Body mass index (BMI) is a measurement that can be used to identify possible weight problems. It estimates body fat based on height and weight. Your health care provider can help determine your BMI and help you achieve or maintain a healthy weight. Get regular exercise Get regular exercise. This is one of the most important things you can do for your health. Most adults should: Exercise for at least 150 minutes each week. The exercise should increase your heart rate and make you sweat (moderate-intensity exercise). Do strengthening exercises at least twice a week. This is in addition to the moderate-intensity exercise. Spend less time sitting. Even light physical activity can be beneficial. Watch cholesterol and blood lipids Have your blood tested for lipids and cholesterol at 27 years of age, then have this test every 5 years. You may need to have your cholesterol levels checked more often if: Your lipid or cholesterol levels are high. You are older than 27 years of age. You are at high risk for heart disease. What should I know about cancer screening? Many types of cancers can be detected early and may often be prevented. Depending on your health history and family history, you may need to have cancer screening at various ages. This may include screening for: Colorectal cancer. Prostate cancer. Skin cancer. Lung  cancer. What should I know about heart disease, diabetes, and high blood pressure? Blood pressure and heart disease High blood pressure causes heart disease and increases the risk of stroke. This is more likely to develop in people who have high blood pressure readings or are overweight. Talk with your health care provider about your target blood pressure readings. Have your blood pressure checked: Every 3-5 years if you are 18-39 years of age. Every year if you are 40 years old or older. If you are between the ages of 65 and 75 and are a current or former smoker, ask your health care provider if you should have a one-time screening for abdominal aortic aneurysm (AAA). Diabetes Have regular diabetes screenings. This checks your fasting blood sugar level. Have the screening done: Once every three years after age 45 if you are at a normal weight and have a low risk for diabetes. More often and at a younger age if you are overweight or have a high risk for diabetes. What should I know about preventing infection? Hepatitis B If you have a higher risk for hepatitis B, you should be screened for this virus. Talk with your health care provider to find out if you are at risk for hepatitis B infection. Hepatitis C Blood testing is recommended for: Everyone born from 1945 through 1965. Anyone with known risk factors for hepatitis C. Sexually transmitted infections (STIs) You should be screened each year for STIs, including gonorrhea and chlamydia, if: You are sexually active and are younger than 27 years of age. You are older than 27 years of age and your   health care provider tells you that you are at risk for this type of infection. Your sexual activity has changed since you were last screened, and you are at increased risk for chlamydia or gonorrhea. Ask your health care provider if you are at risk. Ask your health care provider about whether you are at high risk for HIV. Your health care provider  may recommend a prescription medicine to help prevent HIV infection. If you choose to take medicine to prevent HIV, you should first get tested for HIV. You should then be tested every 3 months for as long as you are taking the medicine. Follow these instructions at home: Alcohol use Do not drink alcohol if your health care provider tells you not to drink. If you drink alcohol: Limit how much you have to 0-2 drinks a day. Know how much alcohol is in your drink. In the [place] one drink equals one 12 oz bottle of beer (355 mL), one 5 oz glass of wine (148 mL), or one 1 oz glass of hard liquor (44 mL). Lifestyle Do not use any products that contain nicotine or tobacco. These products include cigarettes, chewing tobacco, and vaping devices, such as e-cigarettes. If you need help quitting, ask your health care provider. Do not use street drugs. Do not share needles. Ask your health care provider for help if you need support or information about quitting drugs. General instructions Schedule regular health, dental, and eye exams. Stay current with your vaccines. Tell your health care provider if: You often feel depressed. You have ever been abused or do not feel safe at home. Summary Adopting a healthy lifestyle and getting preventive care are important in promoting health and wellness. Follow your health care provider's instructions about healthy diet, exercising, and getting tested or screened for diseases. Follow your health care provider's instructions on monitoring your cholesterol and blood pressure. This information is not intended to replace advice given to you by your health care provider. Make sure you discuss any questions you have with your health care provider. Document Revised: 02/13/2021 Document Reviewed: 02/13/2021 Elsevier Patient Education  2023 [employer]  

## 2022-02-02 NOTE — Telephone Encounter (Signed)
Make sure he is aware insurance wont cover the xopenex inhaler now so I had to send alternate ?

## 2022-02-02 NOTE — Assessment & Plan Note (Signed)
Refilled inhaler for prn use.   Stable, intermittent mild asthma ?

## 2022-02-02 NOTE — Assessment & Plan Note (Signed)
No recent issues

## 2022-02-02 NOTE — Telephone Encounter (Signed)
Left detailed message for patient.

## 2022-02-02 NOTE — Progress Notes (Signed)
Subjective:  ? ?HPI ? Zachary Morris is a 26 y.o. male who presents for ?Chief Complaint  ?Patient presents with  ? fasting cpe  ?  Fasting cpe, no concerns  ? ? ?Patient Care Team: ?Stephanye Finnicum, Leward Quan as PCP - General (Family Medicine) ?Sees dentist ?Sees eye doctor ? ?Concerns: ?None ? ?Migraines - no recent issues. ? ?Asthma - occasional use of inhaler, maybe twice per month.  Can be aggravated by pollen.    ? ?Reviewed their medical, surgical, family, social, medication, and allergy history and updated chart as appropriate. ? ?Past Medical History:  ?Diagnosis Date  ? Abnormal EKG 08/2010  ? Subsequent normal echocardiogram, [facility]  ? Asthma   ? H/O echocardiogram 08/2010  ? Normal, Dr. Kathie Rhodes, pediatric cardiology [facility]  ? Low back pain   ? Migraine 2015  ? Perennial allergic rhinitis   ? Wears glasses   ? ? ?Past Surgical History:  ?Procedure Laterality Date  ? HERNIA REPAIR    ? umbilical  ? ? ?Family History  ?Problem Relation Age of Onset  ? Asthma Mother   ? Diabetes Father   ? Hypertension Father   ? Appendicitis Brother   ? Cancer Maternal Aunt   ?     cervical cancer  ? Hypertension Maternal Grandmother   ? Arthritis Maternal Grandmother   ? Diabetes Maternal Grandmother   ? Hypertension Maternal Grandfather   ? Arthritis Maternal Grandfather   ? Heart disease Neg Hx   ? ? ? ?Current Outpatient Medications:  ?  levalbuterol (XOPENEX HFA) 45 MCG/ACT inhaler, Inhale 2 puffs into the lungs every 8 (eight) hours as needed for wheezing., Disp: 15 g, Rfl: 2 ? ?No Known Allergies ? ? ?Review of Systems ?Constitutional: -fever, -chills, -sweats, -unexpected weight change, -decreased appetite, -fatigue ?Allergy: -sneezing, -itching, -congestion ?Dermatology: -changing moles, --rash, -lumps ?ENT: -runny nose, -ear pain, -sore throat, -hoarseness, -sinus pain, -teeth pain, - ringing in ears, -hearing loss, -nosebleeds ?Cardiology: -chest pain, -palpitations, -swelling, -difficulty  breathing when lying flat, -waking up short of breath ?Respiratory: -cough, -shortness of breath, -difficulty breathing with exercise or exertion, -wheezing, -coughing up blood ?Gastroenterology: -abdominal pain, -nausea, -vomiting, -diarrhea, -constipation, -blood in stool, -changes in bowel movement, -difficulty swallowing or eating ?Hematology: -bleeding, -bruising  ?Musculoskeletal: -joint aches, -muscle aches, -joint swelling, -back pain, -neck pain, -cramping, -changes in gait ?Ophthalmology: denies vision changes, eye redness, itching, discharge ?Urology: -burning with urination, -difficulty urinating, -blood in urine, -urinary frequency, -urgency, -incontinence ?Neurology: -headache, -weakness, -tingling, -numbness, -memory loss, -falls, -dizziness ?Psychology: -depressed mood, -agitation, -sleep problems ?Male GU: no testicular mass, pain, no lymph nodes swollen, no swelling, no rash. ? ? ?  02/02/2022  ?  9:51 AM 11/24/2020  ?  8:37 AM 11/19/2019  ? 12:22 PM 09/17/2018  ?  2:27 PM 09/05/2017  ?  8:28 AM  ?Depression screen PHQ 2/9  ?Decreased Interest 0 0 1 0 0  ?Down, Depressed, Hopeless 0 0 1 1 0  ?PHQ - 2 Score 0 0 2 1 0  ?Altered sleeping   3    ?Tired, decreased energy   2    ?Change in appetite   1    ?Feeling bad or failure about yourself    2    ?Trouble concentrating   1    ?Moving slowly or fidgety/restless   0    ?Suicidal thoughts   1    ?PHQ-9 Score   12    ?Difficult doing  work/chores   Somewhat difficult    ? ? ?   ? ?Objective:  ?BP 120/80   Pulse 84   Ht $R'5\' 6"'xs$  (1.676 m)   Wt 177 lb 3.2 oz (80.4 kg)   BMI 28.60 kg/m?  ? ?Wt Readings from Last 3 Encounters:  ?02/02/22 177 lb 3.2 oz (80.4 kg)  ?11/24/20 179 lb 12.8 oz (81.6 kg)  ?08/10/20 173 lb 9.6 oz (78.7 kg)  ? ? ?General appearance: alert, no distress, WD/WN, African American male, muscular build ?Skin: unremarkable, tattoo right forearm, upper chest, no worrisome lesions ?HEENT: normocephalic, conjunctiva/corneas normal, sclerae  anicteric, PERRLA, EOMi, nares patent, no discharge or erythema, pharynx normal ?Oral cavity: MMM, tongue normal, teeth normal ?Neck: supple, no lymphadenopathy, no thyromegaly, no masses, normal ROM, no bruits ?Chest: non tender, normal shape and expansion ?Heart: RRR, normal S1, S2, no murmurs ?Lungs: CTA bilaterally, no wheezes, rhonchi, or rales ?Abdomen: +bs, soft, non tender, non distended, no masses, no hepatomegaly, no splenomegaly, no bruits ?Back: non tender, normal ROM, no scoliosis ?Musculoskeletal: upper extremities non tender, no obvious deformity, normal ROM throughout, lower extremities non tender, no obvious deformity, normal ROM throughout ?Extremities: no edema, no cyanosis, no clubbing ?Pulses: 2+ symmetric, upper and lower extremities, normal cap refill ?Neurological: alert, oriented x 3, CN2-12 intact, strength normal upper extremities and lower extremities, sensation normal throughout, DTRs 2+ throughout, no cerebellar signs, gait normal ?Psychiatric: normal affect, behavior normal, pleasant  ?GU: normal male external genitalia,circumcised, nontender, no masses, no hernia, no lymphadenopathy ?Rectal: deferred ? ? ?Assessment and Plan :  ? ?Encounter Diagnoses  ?Name Primary?  ? Encounter for health maintenance examination in adult Yes  ? Perennial allergic rhinitis   ? Migraine without status migrainosus, not intractable, unspecified migraine type   ? Screen for STD (sexually transmitted disease)   ? Mild intermittent asthma without complication   ? ? ?This visit was a preventative care visit, also known as wellness visit or routine physical.   Topics typically include healthy lifestyle, diet, exercise, preventative care, vaccinations, sick and well care, proper use of emergency dept and after hours care, as well as other concerns.   ? ? ?Recommendations: ?Continue to return yearly for your annual wellness and preventative care visits.  This gives [place] a chance to discuss healthy lifestyle,  exercise, vaccinations, review your chart record, and perform screenings where appropriate. ? ?I recommend you see your eye doctor yearly for routine vision care. ? ?I recommend you see your dentist yearly for routine dental care including hygiene visits twice yearly. ? ? ?Vaccination recommendations were reviewed ?Immunization History  ?Administered Date(s) Administered  ? DTP 10/04/1995, 12/05/1995, 02/04/1996, 11/11/1996, 10/30/1999  ? HPV Quadrivalent 07/29/2012, 09/29/2012, 09/17/2013  ? Hepatitis A 07/29/2012, 09/17/2013  ? Hepatitis B 11/03/1994, 09/04/1995, 02/04/1996  ? HiB (PRP-OMP) 10/04/1995, 12/05/1995, 02/04/1996, 08/06/1996  ? IPV 10/04/1995, 12/05/1995, 11/05/1999, 02/04/2016  ? Influenza-Unspecified 07/29/2012, 09/17/2013  ? MMR 08/06/1996, 10/30/1999  ? Meningococcal Conjugate 07/29/2012  ? OPV 10/04/1995, 12/05/1995, 02/04/1996, 11/05/1999  ? Td 10/30/1999, 06/04/2007, 07/29/2012  ? Tdap 10/30/1999, 06/04/2007, 09/05/2017  ? Varicella 09/29/2012, 09/17/2013  ? ? ? ?Screening for cancer: ?Colon cancer screening: ?Age 67 ? ?Testicular cancer screening ?You should do a monthly self testicular exam if you are between 79-3 years old ? ?We discussed PSA, prostate exam, and prostate cancer screening risks/benefits.   Age 60 ? ?Skin cancer screening: ?Check your skin regularly for new changes, growing lesions, or other lesions of concern ?Come in for evaluation  if you have skin lesions of concern. ? ?Lung cancer screening: ?If you have a greater than 20 pack year history of tobacco use, then you may qualify for lung cancer screening with a chest CT scan.   Please call your insurance company to inquire about coverage for this test. ? ?We currently don't have screenings for other cancers besides breast, cervical, colon, and lung cancers.  If you have a strong family history of cancer or have other cancer screening concerns, please let me know.  ? ? ?Bone health: ?Get at least 150 minutes of aerobic  exercise weekly ?Get weight bearing exercise at least once weekly ?Bone density test:  ?A bone density test is an imaging test that uses a type of X-ray to measure the amount of calcium and other minerals in your bones. ?The

## 2022-02-04 LAB — HEPATITIS B SURFACE ANTIGEN: Hepatitis B Surface Ag: NEGATIVE

## 2022-02-04 LAB — COMPREHENSIVE METABOLIC PANEL
ALT: 41 IU/L (ref 0–44)
AST: 106 IU/L — ABNORMAL HIGH (ref 0–40)
Albumin/Globulin Ratio: 2 (ref 1.2–2.2)
Albumin: 4.9 g/dL (ref 4.1–5.2)
Alkaline Phosphatase: 56 IU/L (ref 44–121)
BUN/Creatinine Ratio: 11 (ref 9–20)
BUN: 11 mg/dL (ref 6–20)
Bilirubin Total: 0.4 mg/dL (ref 0.0–1.2)
CO2: 26 mmol/L (ref 20–29)
Calcium: 9.8 mg/dL (ref 8.7–10.2)
Chloride: 103 mmol/L (ref 96–106)
Creatinine, Ser: 1.03 mg/dL (ref 0.76–1.27)
Globulin, Total: 2.5 g/dL (ref 1.5–4.5)
Glucose: 85 mg/dL (ref 70–99)
Potassium: 4.8 mmol/L (ref 3.5–5.2)
Sodium: 143 mmol/L (ref 134–144)
Total Protein: 7.4 g/dL (ref 6.0–8.5)
eGFR: 103 mL/min/{1.73_m2} (ref >59)

## 2022-02-04 LAB — CBC
Hematocrit: 44.6 % (ref 37.5–51.0)
Hemoglobin: 14.6 g/dL (ref 13.0–17.7)
MCH: 25.8 pg — ABNORMAL LOW (ref 26.6–33.0)
MCHC: 32.7 g/dL (ref 31.5–35.7)
MCV: 79 fL (ref 79–97)
Platelets: 262 10*3/uL (ref 150–450)
RBC: 5.65 x10E6/uL (ref 4.14–5.80)
RDW: 13 % (ref 11.6–15.4)
WBC: 4.6 10*3/uL (ref 3.4–10.8)

## 2022-02-04 LAB — RPR+HIV+GC+CT PANEL
Chlamydia trachomatis, NAA: NEGATIVE
HIV Screen 4th Generation wRfx: NONREACTIVE
Neisseria Gonorrhoeae by PCR: NEGATIVE
RPR Ser Ql: NONREACTIVE

## 2022-02-04 LAB — HEPATITIS C ANTIBODY: Hep C Virus Ab: NONREACTIVE

## 2022-02-05 ENCOUNTER — Other Ambulatory Visit: Payer: Self-pay | Admitting: Medical

## 2022-02-05 DIAGNOSIS — R7989 Other specified abnormal findings of blood chemistry: Secondary | ICD-10-CM

## 2022-02-05 NOTE — Progress Notes (Signed)
Ti

## 2022-04-03 ENCOUNTER — Emergency Department
Admission: RE | Admit: 2022-04-03 | Discharge: 2022-04-03 | Disposition: A | Discharge status: Home / Self Care | Payer: No Typology Code available for payment source | Source: Ambulatory Visit

## 2022-04-03 ENCOUNTER — Other Ambulatory Visit (HOSPITAL_COMMUNITY)
Admission: RE | Admit: 2022-04-03 | Discharge: 2022-04-03 | Disposition: A | Discharge status: Home / Self Care | Payer: No Typology Code available for payment source | Source: Ambulatory Visit | Attending: Family Medicine | Admitting: Family Medicine

## 2022-04-03 VITALS — BP 138/92 | HR 77 | Temp 98.7°F | Resp 17

## 2022-04-03 DIAGNOSIS — Z202 Contact with and (suspected) exposure to infections with a predominantly sexual mode of transmission: Secondary | ICD-10-CM

## 2022-04-03 DIAGNOSIS — R369 Urethral discharge, unspecified: Secondary | ICD-10-CM

## 2022-04-03 MED ORDER — CEFTRIAXONE SODIUM 500 MG IJ SOLR
500.0000 mg | Freq: Once | INTRAMUSCULAR | Status: AC
  Administered 2022-04-03: 500 mg via INTRAMUSCULAR

## 2022-04-03 MED ORDER — AZITHROMYCIN 500 MG PO TABS
1000.0000 mg | ORAL_TABLET | Freq: Once | ORAL | Status: AC
  Administered 2022-04-03: 1000 mg via ORAL

## 2022-04-05 LAB — CYTOLOGY, (ORAL, ANAL, URETHRAL) ANCILLARY ONLY
Chlamydia: NEGATIVE
Comment: NEGATIVE
Comment: NEGATIVE
Comment: NORMAL
Neisseria Gonorrhea: NEGATIVE
Trichomonas: NEGATIVE

## 2022-06-13 ENCOUNTER — Encounter: Payer: Self-pay | Admitting: Internal Medicine

## 2022-07-17 ENCOUNTER — Encounter: Payer: Self-pay | Admitting: Internal Medicine

## 2023-02-07 ENCOUNTER — Encounter: Payer: Self-pay | Admitting: Medical

## 2023-02-07 ENCOUNTER — Ambulatory Visit: Payer: No Typology Code available for payment source | Admitting: Medical

## 2023-02-07 VITALS — BP 112/76 | HR 71 | Ht 66.0 in | Wt 180.2 lb

## 2023-02-07 DIAGNOSIS — Z0000 Encounter for general adult medical examination without abnormal findings: Secondary | ICD-10-CM

## 2023-02-07 DIAGNOSIS — Z113 Encounter for screening for infections with a predominantly sexual mode of transmission: Secondary | ICD-10-CM

## 2023-02-07 DIAGNOSIS — R7989 Other specified abnormal findings of blood chemistry: Secondary | ICD-10-CM

## 2023-02-07 LAB — POCT URINALYSIS DIP (PROADVANTAGE DEVICE)
Bilirubin, UA: NEGATIVE
Blood, UA: NEGATIVE
Glucose, UA: NEGATIVE mg/dL
Ketones, POC UA: NEGATIVE mg/dL
Leukocytes, UA: NEGATIVE
Nitrite, UA: NEGATIVE
Protein Ur, POC: NEGATIVE mg/dL
Specific Gravity, Urine: 1.02
Urobilinogen, Ur: NEGATIVE
pH, UA: 6 (ref 5.0–8.0)

## 2023-02-07 LAB — IRON

## 2023-02-07 NOTE — Patient Instructions (Addendum)
Insomnia Insomnia is a sleep disorder that makes it difficult to fall asleep or stay asleep. Insomnia can cause fatigue, low energy, difficulty concentrating, mood swings, and poor performance at work or school. There are three different ways to classify insomnia: Difficulty falling asleep. Difficulty staying asleep. Waking up too early in the morning. Any type of insomnia can be long-term (chronic) or short-term (acute). Both are common. Short-term insomnia usually lasts for 3 months or less. Chronic insomnia occurs at least three times a week for longer than 3 months. What are the causes? Insomnia may be caused by another condition, situation, or substance, such as: Having certain mental health conditions, such as anxiety and depression. Using caffeine, alcohol, tobacco, or drugs. Having gastrointestinal conditions, such as gastroesophageal reflux disease (GERD). Having certain medical conditions. These include: Asthma. Alzheimer's disease. Stroke. Chronic pain. An overactive thyroid gland (hyperthyroidism). Other sleep disorders, such as restless legs syndrome and sleep apnea. Menopause. Sometimes, the cause of insomnia may not be known. What increases the risk? Risk factors for insomnia include: Gender. Females are affected more often than males. Age. Insomnia is more common as people get older. Stress and certain medical and mental health conditions. Lack of exercise. Having an irregular work schedule. This may include working night shifts and traveling between different time zones. What are the signs or symptoms? If you have insomnia, the main symptom is having trouble falling asleep or having trouble staying asleep. This may lead to other symptoms, such as: Feeling tired or having low energy. Feeling nervous about going to sleep. Not feeling rested in the morning. Having trouble concentrating. Feeling irritable, anxious, or depressed. How is this diagnosed? This condition  may be diagnosed based on: Your symptoms and medical history. Your health care provider may ask about: Your sleep habits. Any medical conditions you have. Your mental health. A physical exam. How is this treated? Treatment for insomnia depends on the cause. Treatment may focus on treating an underlying condition that is causing the insomnia. Treatment may also include: Medicines to help you sleep. Counseling or therapy. Lifestyle adjustments to help you sleep better. Follow these instructions at home: Eating and drinking  Limit or avoid alcohol, caffeinated beverages, and products that contain nicotine and tobacco, especially close to bedtime. These can disrupt your sleep. Do not eat a large meal or eat spicy foods right before bedtime. This can lead to digestive discomfort that can make it hard for you to sleep. Sleep habits  Keep a sleep diary to help you and your health care provider figure out what could be causing your insomnia. Write down: When you sleep. When you wake up during the night. How well you sleep and how rested you feel the next day. Any side effects of medicines you are taking. What you eat and drink. Make your bedroom a dark, comfortable place where it is easy to fall asleep. Put up shades or blackout curtains to block light from outside. Use a Budney noise machine to block noise. Keep the temperature cool. Limit screen use before bedtime. This includes: Not watching TV. Not using your smartphone, tablet, or computer. Stick to a routine that includes going to bed and waking up at the same times every day and night. This can help you fall asleep faster. Consider making a quiet activity, such as reading, part of your nighttime routine. Try to avoid taking naps during the day so that you sleep better at night. Get out of bed if you are still awake after   15 minutes of trying to sleep. Keep the lights down, but try reading or doing a quiet activity. When you feel  sleepy, go back to bed. General instructions Take over-the-counter and prescription medicines only as told by your health care provider. Exercise regularly as told by your health care provider. However, avoid exercising in the hours right before bedtime. Use relaxation techniques to manage stress. Ask your health care provider to suggest some techniques that may work well for you. These may include: Breathing exercises. Routines to release muscle tension. Visualizing peaceful scenes. Make sure that you drive carefully. Do not drive if you feel very sleepy. Keep all follow-up visits. This is important. Contact a health care provider if: You are tired throughout the day. You have trouble in your daily routine due to sleepiness. You continue to have sleep problems, or your sleep problems get worse. Get help right away if: You have thoughts about hurting yourself or someone else. Get help right away if you feel like you may hurt yourself or others, or have thoughts about taking your own life. Go to your nearest emergency room or: Call 911. Call the National Suicide Prevention Lifeline at 1-800-273-8255 or 988. This is open 24 hours a day. Text the Crisis Text Line at 741741. Summary Insomnia is a sleep disorder that makes it difficult to fall asleep or stay asleep. Insomnia can be long-term (chronic) or short-term (acute). Treatment for insomnia depends on the cause. Treatment may focus on treating an underlying condition that is causing the insomnia. Keep a sleep diary to help you and your health care provider figure out what could be causing your insomnia. This information is not intended to replace advice given to you by your health care provider. Make sure you discuss any questions you have with your health care provider. Document Revised: 09/04/2021 Document Reviewed: 09/04/2021 Elsevier Patient Education  2023 [employer] Healthy Living: Sleep In this video, you will learn why sleep  is an important part of a healthy lifestyle. To view the content, go to this web address: [email or web address]  This video will expire on: 09/19/2024. If you need access to this video following this date, please reach out to the healthcare provider who assigned it to you. This information is not intended to replace advice given to you by your health care provider. Make sure you discuss any questions you have with your health care provider. Elsevier Patient Education  2023 [employer]  

## 2023-02-07 NOTE — Progress Notes (Signed)
Subjective:   HPI  Zachary Morris is a 27 y.o. male who presents for Chief Complaint  Patient presents with   fasting cpe    Fasting cpe, no concerns    Patient Care Team: Jarica Plass, Cleda Mccreedy as PCP - General (Family Medicine) Sees dentist Sees eye doctor  Concerns: Asthma-uses rescue inhaler preventatively for exercise and does fine on this.  No current issues otherwise  He has long-term issues with sleep.  Tried some melatonin but it did not seem to help.  He only sleeps for couple hours at a time.  He drinks mainly Coca-Cola.  He does not drink much water at all  He still is very active both exercising regularly and on the job at [employer] loading trucks     Reviewed their medical, surgical, family, social, medication, and allergy history and updated chart as appropriate.  Past Medical History:  Diagnosis Date   Abnormal EKG 08/2010   Subsequent normal echocardiogram, [facility]   Asthma    H/O echocardiogram 08/2010   Normal, Dr. Cristy Folks, pediatric cardiology [facility]   Low back pain    Migraine 2015   Perennial allergic rhinitis    Wears glasses     Past Surgical History:  Procedure Laterality Date   HERNIA REPAIR     umbilical    Family History  Problem Relation Age of Onset   Asthma Mother    Diabetes Father    Hypertension Father    Appendicitis Brother    Cancer Maternal Aunt        cervical cancer   Hypertension Maternal Grandmother    Arthritis Maternal Grandmother    Diabetes Maternal Grandmother    Hypertension Maternal Grandfather    Arthritis Maternal Grandfather    Heart disease Neg Hx      Current Outpatient Medications:    albuterol (VENTOLIN HFA) 108 (90 Base) MCG/ACT inhaler, Inhale 2 puffs into the lungs every 6 (six) hours as needed for wheezing or shortness of breath., Disp: 8 g, Rfl: 2  No Known Allergies   Review of Systems  Constitutional:  Negative for chills, fever, malaise/fatigue and weight loss.   HENT:  Negative for congestion, ear pain, hearing loss, sore throat and tinnitus.   Eyes:  Negative for blurred vision, pain and redness.  Respiratory:  Negative for cough, hemoptysis and shortness of breath.   Cardiovascular:  Negative for chest pain, palpitations, orthopnea, claudication and leg swelling.  Gastrointestinal:  Negative for abdominal pain, blood in stool, constipation, diarrhea, nausea and vomiting.  Genitourinary:  Negative for dysuria, flank pain, frequency, hematuria and urgency.  Musculoskeletal:  Negative for falls, joint pain and myalgias.  Skin:  Negative for itching and rash.  Neurological:  Negative for dizziness, tingling, speech change, weakness and headaches.  Endo/Heme/Allergies:  Negative for polydipsia. Does not bruise/bleed easily.  Psychiatric/Behavioral:  Negative for depression and memory loss. The patient has insomnia. The patient is not nervous/anxious.         02/07/2023    8:25 AM 02/02/2022    9:51 AM 11/24/2020    8:37 AM 11/19/2019   12:22 PM 09/17/2018    2:27 PM  Depression screen PHQ 2/9  Decreased Interest 0 0 0 1 0  Down, Depressed, Hopeless 0 0 0 1 1  PHQ - 2 Score 0 0 0 2 1  Altered sleeping    3   Tired, decreased energy    2   Change in appetite  1   Feeling bad or failure about yourself     2   Trouble concentrating    1   Moving slowly or fidgety/restless    0   Suicidal thoughts    1   PHQ-9 Score    12   Difficult doing work/chores    Somewhat difficult         Objective:  BP 112/76   Pulse 71   Ht 5\' 6"  (1.676 m)   Wt 180 lb 3.2 oz (81.7 kg)   BMI 29.09 kg/m   Wt Readings from Last 3 Encounters:  02/07/23 180 lb 3.2 oz (81.7 kg)  02/02/22 177 lb 3.2 oz (80.4 kg)  11/24/20 179 lb 12.8 oz (81.6 kg)    General appearance: alert, no distress, WD/WN, African American male, muscular build Skin: unremarkable, tattoo right forearm, upper chest, no worrisome lesions HEENT: normocephalic, conjunctiva/corneas normal,  sclerae anicteric, PERRLA, EOMi, nares patent, no discharge or erythema, pharynx normal Oral cavity: MMM, tongue normal, teeth normal Neck: supple, no lymphadenopathy, no thyromegaly, no masses, normal ROM, no bruits Chest: non tender, normal shape and expansion Heart: RRR, normal S1, S2, no murmurs Lungs: CTA bilaterally, no wheezes, rhonchi, or rales Abdomen: +bs, soft, non tender, non distended, no masses, no hepatomegaly, no splenomegaly, no bruits Back: non tender, normal ROM, no scoliosis Musculoskeletal: upper extremities non tender, no obvious deformity, normal ROM throughout, lower extremities non tender, no obvious deformity, normal ROM throughout Extremities: no edema, no cyanosis, no clubbing Pulses: 2+ symmetric, upper and lower extremities, normal cap refill Neurological: alert, oriented x 3, CN2-12 intact, strength normal upper extremities and lower extremities, sensation normal throughout, DTRs 2+ throughout, no cerebellar signs, gait normal Psychiatric: normal affect, behavior normal, pleasant  GU: normal male external genitalia,circumcised, nontender, no masses, no hernia, no lymphadenopathy Rectal: deferred   Assessment and Plan :   Encounter Diagnoses  Name Primary?   Encounter for health maintenance examination in adult Yes   Screen for STD (sexually transmitted disease)    Elevated LFTs     This visit was a preventative care visit, also known as wellness visit or routine physical.   Topics typically include healthy lifestyle, diet, exercise, preventative care, vaccinations, sick and well care, proper use of emergency dept and after hours care, as well as other concerns.     Recommendations: Continue to return yearly for your annual wellness and preventative care visits.  This gives [place] a chance to discuss healthy lifestyle, exercise, vaccinations, review your chart record, and perform screenings where appropriate.  I recommend you see your eye doctor yearly for  routine vision care.  I recommend you see your dentist yearly for routine dental care including hygiene visits twice yearly.   Vaccination recommendations were reviewed Immunization History  Administered Date(s) Administered   DTP 10/04/1995, 12/05/1995, 02/04/1996, 11/11/1996, 10/30/1999   HIB (PRP-OMP) 10/04/1995, 12/05/1995, 02/04/1996, 08/06/1996   HPV Quadrivalent 07/29/2012, 09/29/2012, 09/17/2013   Hepatitis A 07/29/2012, 09/17/2013   Hepatitis B 11/03/1994, 09/04/1995, 02/04/1996   IPV 10/04/1995, 12/05/1995, 11/05/1999, 02/04/2016   Influenza-Unspecified 07/29/2012, 09/17/2013   MMR 08/06/1996, 10/30/1999   Meningococcal Conjugate 07/29/2012   OPV 10/04/1995, 12/05/1995, 02/04/1996, 11/05/1999   Td 10/30/1999, 06/04/2007, 07/29/2012   Tdap 10/30/1999, 06/04/2007, 09/05/2017   Varicella 09/29/2012, 09/17/2013     Screening for cancer: Colon cancer screening: Age 59  Testicular cancer screening You should do a monthly self testicular exam if you are between 12-45 years old  We discussed PSA, prostate exam,  and prostate cancer screening risks/benefits.   Age 25  Skin cancer screening: Check your skin regularly for new changes, growing lesions, or other lesions of concern Come in for evaluation if you have skin lesions of concern.  Lung cancer screening: If you have a greater than 20 pack year history of tobacco use, then you may qualify for lung cancer screening with a chest CT scan.   Please call your insurance company to inquire about coverage for this test.  We currently don't have screenings for other cancers besides breast, cervical, colon, and lung cancers.  If you have a strong family history of cancer or have other cancer screening concerns, please let me know.    Bone health: Get at least 150 minutes of aerobic exercise weekly Get weight bearing exercise at least once weekly Bone density test:  A bone density test is an imaging test that uses a type of  X-ray to measure the amount of calcium and other minerals in your bones. The test may be used to diagnose or screen you for a condition that causes weak or thin bones (osteoporosis), predict your risk for a broken bone (fracture), or determine how well your osteoporosis treatment is working. The bone density test is recommended for females 65 and older, or females or males <65 if certain risk factors such as thyroid disease, long term use of steroids such as for asthma or rheumatological issues, vitamin D deficiency, estrogen deficiency, family history of osteoporosis, self or family history of fragility fracture in first degree relative.    Heart health: Get at least 150 minutes of aerobic exercise weekly Limit alcohol It is important to maintain a healthy blood pressure and healthy cholesterol numbers  Heart disease screening: Screening for heart disease includes screening for blood pressure, fasting lipids, glucose/diabetes screening, BMI height to weight ratio, reviewed of smoking status, physical activity, and diet.    Goals include blood pressure 120/80 or less, maintaining a healthy lipid/cholesterol profile, preventing diabetes or keeping diabetes numbers under good control, not smoking or using tobacco products, exercising most days per week or at least 150 minutes per week of exercise, and eating healthy variety of fruits and vegetables, healthy oils, and avoiding unhealthy food choices like fried food, fast food, high sugar and high cholesterol foods.    Other tests may possibly include EKG test, CT coronary calcium score, echocardiogram, exercise treadmill stress test.    Medical care options: I recommend you continue to seek care here first for routine care.  We try really hard to have available appointments Monday through Friday daytime hours for sick visits, acute visits, and physicals.  Urgent care should be used for after hours and weekends for significant issues that cannot wait  till the next day.  The emergency department should be used for significant potentially life-threatening emergencies.  The emergency department is expensive, can often have long wait times for less significant concerns, so try to utilize primary care, urgent care, or telemedicine when possible to avoid unnecessary trips to the emergency department.  Virtual visits and telemedicine have been introduced since the pandemic started in 2020, and can be convenient ways to receive medical care.  We offer virtual appointments as well to assist you in a variety of options to seek medical care.   Separate significant issues discussed: Asthma-continue rescue inhaler as needed or preventatively for exercise  Elevated liver test last year-he drinks no alcohol, only uses over-the-counter analgesics occasionally.  If liver test still elevated this time  we will need to pursue ultrasound  Insomnia - discussed sleep hygiene, limiting caffeine, journaling and ways to cope.   Discussed possible referral to neurology if desired.  Yostin was seen today for fasting cpe.  Diagnoses and all orders for this visit:  Encounter for health maintenance examination in adult -     Comprehensive metabolic panel -     CBC -     RPR+HIV+GC+CT Panel -     Hepatitis C antibody -     Hepatitis B surface antigen -     Iron -     POCT Urinalysis DIP (Proadvantage Device)  Screen for STD (sexually transmitted disease) -     RPR+HIV+GC+CT Panel -     Hepatitis C antibody -     Hepatitis B surface antigen  Elevated LFTs -     Iron -     POCT Urinalysis DIP (Proadvantage Device)      Follow-up pending labs, yearly for physical

## 2023-02-08 NOTE — Progress Notes (Signed)
Results sent through MyChart

## 2023-02-09 LAB — RPR+HIV+GC+CT PANEL
Chlamydia trachomatis, NAA: NEGATIVE
HIV Screen 4th Generation wRfx: NONREACTIVE
Neisseria Gonorrhoeae by PCR: NEGATIVE
RPR Ser Ql: NONREACTIVE

## 2023-02-09 LAB — COMPREHENSIVE METABOLIC PANEL
ALT: 13 IU/L (ref 0–44)
AST: 17 IU/L (ref 0–40)
Albumin/Globulin Ratio: 1.7 (ref 1.2–2.2)
Albumin: 4.7 g/dL (ref 4.3–5.2)
Alkaline Phosphatase: 60 IU/L (ref 44–121)
BUN/Creatinine Ratio: 10 (ref 9–20)
BUN: 12 mg/dL (ref 6–20)
Bilirubin Total: 0.3 mg/dL (ref 0.0–1.2)
CO2: 21 mmol/L (ref 20–29)
Calcium: 9.4 mg/dL (ref 8.7–10.2)
Chloride: 104 mmol/L (ref 96–106)
Creatinine, Ser: 1.16 mg/dL (ref 0.76–1.27)
Globulin, Total: 2.8 g/dL (ref 1.5–4.5)
Glucose: 91 mg/dL (ref 70–99)
Potassium: 4.6 mmol/L (ref 3.5–5.2)
Sodium: 142 mmol/L (ref 134–144)
Total Protein: 7.5 g/dL (ref 6.0–8.5)
eGFR: 89 mL/min/{1.73_m2} (ref >59)

## 2023-02-09 LAB — HEPATITIS B SURFACE ANTIGEN: Hepatitis B Surface Ag: NEGATIVE

## 2023-02-09 LAB — CBC
Hematocrit: 46.1 % (ref 37.5–51.0)
Hemoglobin: 14.8 g/dL (ref 13.0–17.7)
MCH: 25.8 pg — ABNORMAL LOW (ref 26.6–33.0)
MCHC: 32.1 g/dL (ref 31.5–35.7)
MCV: 81 fL (ref 79–97)
Platelets: 220 10*3/uL (ref 150–450)
RBC: 5.73 x10E6/uL (ref 4.14–5.80)
RDW: 13.1 % (ref 11.6–15.4)
WBC: 5 10*3/uL (ref 3.4–10.8)

## 2023-02-09 LAB — HEPATITIS C ANTIBODY: Hep C Virus Ab: NONREACTIVE

## 2023-02-11 ENCOUNTER — Other Ambulatory Visit: Payer: Self-pay | Admitting: Medical

## 2023-02-11 MED ORDER — ALBUTEROL SULFATE HFA 108 (90 BASE) MCG/ACT IN AERS: 2.0000 | INHALATION_SPRAY | Freq: Four times a day (QID) | RESPIRATORY_TRACT | 3 refills | Status: DC | PRN

## 2023-02-11 NOTE — Progress Notes (Signed)
Results sent through MyChart

## 2024-02-13 ENCOUNTER — Encounter: Payer: Self-pay | Admitting: Medical

## 2024-02-13 ENCOUNTER — Ambulatory Visit: Payer: No Typology Code available for payment source | Admitting: Medical

## 2024-02-13 VITALS — BP 112/80 | HR 64 | Ht 67.0 in | Wt 188.8 lb

## 2024-02-13 DIAGNOSIS — Z113 Encounter for screening for infections with a predominantly sexual mode of transmission: Secondary | ICD-10-CM

## 2024-02-13 DIAGNOSIS — B009 Herpesviral infection, unspecified: Secondary | ICD-10-CM | POA: Insufficient documentation

## 2024-02-13 DIAGNOSIS — R42 Dizziness and giddiness: Secondary | ICD-10-CM

## 2024-02-13 DIAGNOSIS — Z0000 Encounter for general adult medical examination without abnormal findings: Secondary | ICD-10-CM

## 2024-02-13 DIAGNOSIS — Z973 Presence of spectacles and contact lenses: Secondary | ICD-10-CM | POA: Diagnosis not present

## 2024-02-13 LAB — LIPID PANEL

## 2024-02-13 MED ORDER — VALACYCLOVIR HCL 500 MG PO TABS: 500.0000 mg | ORAL_TABLET | Freq: Two times a day (BID) | ORAL | 2 refills | Status: AC

## 2024-02-13 NOTE — Progress Notes (Signed)
 Subjective:   HPI  Zachary Morris is a 28 y.o. male who presents for Chief Complaint  Patient presents with   Annual Exam    Fasting cpe, feels like he is being flipped upside down- happens about once a month. Last month is the last episode     Patient Care Team: Aseneth Hack, Newt Barefoot as PCP - General (Family Medicine) Sees dentist Sees eye doctor  Concerns: In the past few months he has had brief episodes of where he feels like things are inverted or off balance.  He feels like he is flipped upside down.  It lasts a few seconds but it happens about once a month.  He does not necessarily feel nauseous.  No chest pain or palpitations.  No shortness of breath.  No confusion.  No numbness or tingling.  He would describe it as off-balance or dizzy at times.  That is very brief  He does exercise regularly.  His workouts last 2 to 3 hours.  He continues with aerobic and weightlifting regularly.  He says he drinks about 32 ounces of water a day  He takes supplement creatinine maybe on average 3 times a month.  No other supplements.  He also notes that back in January he was diagnosed with genital herpes simplex outbreak.  No prior similar.  He was put on Valtrex.  He had redness blisters rash and pain and swollen lymph nodes.  He has a male partner.  No current symptoms.  No new partners.  Saw eye doctor 11/2023.  Asthma-uses rescue inhaler preventatively for exercise and does fine on this.  No current issues otherwise    Reviewed their medical, surgical, family, social, medication, and allergy history and updated chart as appropriate.  Past Medical History:  Diagnosis Date   Abnormal EKG 08/2010   Subsequent normal echocardiogram, [facility]   Asthma    H/O echocardiogram 08/2010   Normal, Dr. Edelmira Goodness, pediatric cardiology [facility]   Low back pain    Migraine 2015   Perennial allergic rhinitis    Wears glasses     Past Surgical History:  Procedure  Laterality Date   HERNIA REPAIR     umbilical    Family History  Problem Relation Age of Onset   Asthma Mother    Diabetes Father    Hypertension Father    Appendicitis Brother    Cancer Maternal Aunt        cervical cancer   Hypertension Maternal Grandmother    Arthritis Maternal Grandmother    Diabetes Maternal Grandmother    Hypertension Maternal Grandfather    Arthritis Maternal Grandfather    Heart disease Neg Hx      Current Outpatient Medications:    albuterol  (VENTOLIN  HFA) 108 (90 Base) MCG/ACT inhaler, Inhale 2 puffs into the lungs every 6 (six) hours as needed for wheezing or shortness of breath., Disp: 18 g, Rfl: 3   valACYclovir (VALTREX) 500 MG tablet, Take 1 tablet (500 mg total) by mouth 2 (two) times daily. X 3 days for outbreak, Disp: 30 tablet, Rfl: 2  No Known Allergies   Review of Systems  Constitutional:  Negative for chills, fever, malaise/fatigue and weight loss.  HENT:  Negative for congestion, ear pain, hearing loss, sore throat and tinnitus.   Eyes:  Negative for blurred vision, pain and redness.  Respiratory:  Negative for cough, hemoptysis and shortness of breath.   Cardiovascular:  Negative for chest pain, palpitations, orthopnea, claudication and leg  swelling.  Gastrointestinal:  Negative for abdominal pain, blood in stool, constipation, diarrhea, nausea and vomiting.  Genitourinary:  Negative for dysuria, flank pain, frequency, hematuria and urgency.  Musculoskeletal:  Negative for falls, joint pain and myalgias.  Skin:  Negative for itching and rash.  Neurological:  Positive for dizziness. Negative for tingling, speech change, weakness and headaches.  Endo/Heme/Allergies:  Negative for polydipsia. Does not bruise/bleed easily.  Psychiatric/Behavioral:  Negative for depression and memory loss. The patient is not nervous/anxious and does not have insomnia.         02/13/2024    9:23 AM 02/07/2023    8:25 AM 02/02/2022    9:51 AM 11/24/2020     8:37 AM 11/19/2019   12:22 PM  Depression screen PHQ 2/9  Decreased Interest 0 0 0 0 1  Down, Depressed, Hopeless 0 0 0 0 1  PHQ - 2 Score 0 0 0 0 2  Altered sleeping     3  Tired, decreased energy     2  Change in appetite     1  Feeling bad or failure about yourself      2  Trouble concentrating     1  Moving slowly or fidgety/restless     0  Suicidal thoughts     1  PHQ-9 Score     12  Difficult doing work/chores     Somewhat difficult        Objective:  BP 112/80   Pulse 64   Ht 5\' 7"  (1.702 m)   Wt 188 lb 12.8 oz (85.6 kg)   BMI 29.57 kg/m   Wt Readings from Last 3 Encounters:  02/13/24 188 lb 12.8 oz (85.6 kg)  02/07/23 180 lb 3.2 oz (81.7 kg)  02/02/22 177 lb 3.2 oz (80.4 kg)    General appearance: alert, no distress, WD/WN, African American male, muscular build Skin: unremarkable, tattoo right forearm, right deltoid, upper chest, no worrisome lesions HEENT: normocephalic, conjunctiva/corneas normal, sclerae anicteric, PERRLA, EOMi, nares patent, no discharge or erythema, pharynx normal Oral cavity: MMM, tongue normal, teeth normal Neck: supple, no lymphadenopathy, no thyromegaly, no masses, normal ROM, no bruits Chest: non tender, normal shape and expansion Heart: RRR, normal S1, S2, no murmurs Lungs: CTA bilaterally, no wheezes, rhonchi, or rales Abdomen: +bs, soft, non tender, non distended, no masses, no hepatomegaly, no splenomegaly, no bruits Back: non tender, normal ROM, no scoliosis Musculoskeletal: upper extremities non tender, no obvious deformity, normal ROM throughout, lower extremities non tender, no obvious deformity, normal ROM throughout Extremities: no edema, no cyanosis, no clubbing Pulses: 2+ symmetric, upper and lower extremities, normal cap refill Neurological: alert, oriented x 3, CN2-12 intact, strength normal upper extremities and lower extremities, sensation normal throughout, DTRs 2+ throughout, no cerebellar signs, gait  normal Psychiatric: normal affect, behavior normal, pleasant  GU: normal male external genitalia,circumcised, nontender, no masses, no hernia, no lymphadenopathy Rectal: deferred   Assessment and Plan :   Encounter Diagnoses  Name Primary?   Encounter for health maintenance examination in adult Yes   Disequilibrium    Dizziness    Wears glasses    Screen for STD (sexually transmitted disease)    Herpes simplex      This visit was a preventative care visit, also known as wellness visit or routine physical.   Topics typically include healthy lifestyle, diet, exercise, preventative care, vaccinations, sick and well care, proper use of emergency dept and after hours care, as well as other concerns.  Recommendations: Continue to return yearly for your annual wellness and preventative care visits.  This gives us  a chance to discuss healthy lifestyle, exercise, vaccinations, review your chart record, and perform screenings where appropriate.  I recommend you see your eye doctor yearly for routine vision care.  I recommend you see your dentist yearly for routine dental care including hygiene visits twice yearly.   Vaccination recommendations were reviewed Immunization History  Administered Date(s) Administered   DTP 10/04/1995, 12/05/1995, 02/04/1996, 11/11/1996, 10/30/1999   HIB (PRP-OMP) 10/04/1995, 12/05/1995, 02/04/1996, 08/06/1996   HPV Quadrivalent 07/29/2012, 09/29/2012, 09/17/2013   Hepatitis A 07/29/2012, 09/17/2013   Hepatitis B 11/03/1994, 09/04/1995, 02/04/1996   IPV 10/04/1995, 12/05/1995, 11/05/1999, 02/04/2016   Influenza-Unspecified 07/29/2012, 09/17/2013   MMR 08/06/1996, 10/30/1999   Meningococcal Conjugate 07/29/2012   OPV 10/04/1995, 12/05/1995, 02/04/1996, 11/05/1999   Td 10/30/1999, 06/04/2007, 07/29/2012   Tdap 10/30/1999, 06/04/2007, 09/05/2017   Varicella 09/29/2012, 09/17/2013     Screening for cancer: Colon cancer screening: Age  75  Testicular cancer screening You should do a monthly self testicular exam if you are between 26-81 years old  We discussed PSA, prostate exam, and prostate cancer screening risks/benefits.   Age 67  Skin cancer screening: Check your skin regularly for new changes, growing lesions, or other lesions of concern Come in for evaluation if you have skin lesions of concern.  Lung cancer screening: If you have a greater than 20 pack year history of tobacco use, then you may qualify for lung cancer screening with a chest CT scan.   Please call your insurance company to inquire about coverage for this test.  We currently don't have screenings for other cancers besides breast, cervical, colon, and lung cancers.  If you have a strong family history of cancer or have other cancer screening concerns, please let me know.    Bone health: Get at least 150 minutes of aerobic exercise weekly Get weight bearing exercise at least once weekly Bone density test:  A bone density test is an imaging test that uses a type of X-ray to measure the amount of calcium and other minerals in your bones. The test may be used to diagnose or screen you for a condition that causes weak or thin bones (osteoporosis), predict your risk for a broken bone (fracture), or determine how well your osteoporosis treatment is working. The bone density test is recommended for females 65 and older, or females or males <65 if certain risk factors such as thyroid disease, long term use of steroids such as for asthma or rheumatological issues, vitamin D deficiency, estrogen deficiency, family history of osteoporosis, self or family history of fragility fracture in first degree relative.    Heart health: Get at least 150 minutes of aerobic exercise weekly Limit alcohol It is important to maintain a healthy blood pressure and healthy cholesterol numbers  Heart disease screening: Screening for heart disease includes screening for blood  pressure, fasting lipids, glucose/diabetes screening, BMI height to weight ratio, reviewed of smoking status, physical activity, and diet.    Goals include blood pressure 120/80 or less, maintaining a healthy lipid/cholesterol profile, preventing diabetes or keeping diabetes numbers under good control, not smoking or using tobacco products, exercising most days per week or at least 150 minutes per week of exercise, and eating healthy variety of fruits and vegetables, healthy oils, and avoiding unhealthy food choices like fried food, fast food, high sugar and high cholesterol foods.    Other tests may possibly include EKG test, CT  coronary calcium score, echocardiogram, exercise treadmill stress test.    Medical care options: I recommend you continue to seek care here first for routine care.  We try really hard to have available appointments Monday through Friday daytime hours for sick visits, acute visits, and physicals.  Urgent care should be used for after hours and weekends for significant issues that cannot wait till the next day.  The emergency department should be used for significant potentially life-threatening emergencies.  The emergency department is expensive, can often have long wait times for less significant concerns, so try to utilize primary care, urgent care, or telemedicine when possible to avoid unnecessary trips to the emergency department.  Virtual visits and telemedicine have been introduced since the pandemic started in 2020, and can be convenient ways to receive medical care.  We offer virtual appointments as well to assist you in a variety of options to seek medical care.   Separate significant issues discussed: Asthma-continue rescue inhaler as needed or preventatively for exercise  Disequlibirium, dizziness -we discussed symptoms and concerns possible differential.  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  Valtrex prescribed for as needed use  Irl was seen today for annual exam.  Diagnoses and all orders for this visit:  Encounter for health maintenance examination in adult -     Comprehensive metabolic panel with GFR -     CBC -     Lipid panel -     TSH -     RPR+HIV+GC+CT Panel -     Hepatitis C antibody -     Hepatitis B surface antigen -     Vitamin B12 -     EKG 12-Lead  Disequilibrium -     Comprehensive metabolic panel with GFR -     CBC -     Vitamin B12 -     EKG 12-Lead -     Orthostatic vital signs  Dizziness -     Comprehensive metabolic panel with GFR -     CBC -     Vitamin B12 -     EKG 12-Lead -     Orthostatic vital signs  Wears glasses  Screen for STD (sexually transmitted disease) -     RPR+HIV+GC+CT Panel -     Hepatitis C antibody -     Hepatitis B surface antigen  Herpes simplex  Other orders -     valACYclovir (VALTREX) 500 MG tablet; Take 1 tablet (500 mg total) by mouth 2 (two) times daily. X 3 days for outbreak      Follow-up pending labs, yearly for physical

## 2024-02-14 ENCOUNTER — Other Ambulatory Visit: Payer: Self-pay | Admitting: Medical

## 2024-02-14 MED ORDER — ALBUTEROL SULFATE HFA 108 (90 BASE) MCG/ACT IN AERS
2.0000 | INHALATION_SPRAY | Freq: Four times a day (QID) | RESPIRATORY_TRACT | 3 refills | Status: DC | PRN
Start: 2024-02-14 — End: 2024-06-03

## 2024-02-14 NOTE — Progress Notes (Signed)
 Results sent through MyChart

## 2024-02-15 LAB — LIPID PANEL
Chol/HDL Ratio: 3.1 ratio (ref 0.0–5.0)
Cholesterol, Total: 154 mg/dL (ref 100–199)
HDL: 49 mg/dL (ref >39)
LDL Chol Calc (NIH): 94 mg/dL (ref 0–99)
Triglycerides: 53 mg/dL (ref 0–149)
VLDL Cholesterol Cal: 11 mg/dL (ref 5–40)

## 2024-02-15 LAB — COMPREHENSIVE METABOLIC PANEL WITH GFR
ALT: 19 IU/L (ref 0–44)
AST: 27 IU/L (ref 0–40)
Albumin: 4.6 g/dL (ref 4.3–5.2)
Alkaline Phosphatase: 49 IU/L (ref 44–121)
BUN/Creatinine Ratio: 10 (ref 9–20)
BUN: 12 mg/dL (ref 6–20)
Bilirubin Total: 0.4 mg/dL (ref 0.0–1.2)
CO2: 18 mmol/L — ABNORMAL LOW (ref 20–29)
Calcium: 9.2 mg/dL (ref 8.7–10.2)
Chloride: 105 mmol/L (ref 96–106)
Creatinine, Ser: 1.24 mg/dL (ref 0.76–1.27)
Globulin, Total: 2.9 g/dL (ref 1.5–4.5)
Glucose: 81 mg/dL (ref 70–99)
Potassium: 4.1 mmol/L (ref 3.5–5.2)
Sodium: 140 mmol/L (ref 134–144)
Total Protein: 7.5 g/dL (ref 6.0–8.5)
eGFR: 81 mL/min/{1.73_m2} (ref >59)

## 2024-02-15 LAB — RPR+HIV+GC+CT PANEL
HIV Screen 4th Generation wRfx: NONREACTIVE
RPR Ser Ql: NONREACTIVE

## 2024-02-15 LAB — CBC
Hematocrit: 43.6 % (ref 37.5–51.0)
Hemoglobin: 14.2 g/dL (ref 13.0–17.7)
MCH: 26.5 pg — ABNORMAL LOW (ref 26.6–33.0)
MCHC: 32.6 g/dL (ref 31.5–35.7)
MCV: 82 fL (ref 79–97)
Platelets: 206 10*3/uL (ref 150–450)
RBC: 5.35 x10E6/uL (ref 4.14–5.80)
RDW: 13.3 % (ref 11.6–15.4)
WBC: 4.6 10*3/uL (ref 3.4–10.8)

## 2024-02-15 LAB — HEPATITIS C ANTIBODY: Hep C Virus Ab: NONREACTIVE

## 2024-02-15 LAB — TSH: TSH: 0.794 u[IU]/mL (ref 0.450–4.500)

## 2024-02-15 LAB — HEPATITIS B SURFACE ANTIGEN: Hepatitis B Surface Ag: NEGATIVE

## 2024-02-15 LAB — VITAMIN B12: Vitamin B-12: 446 pg/mL (ref 232–1245)

## 2024-02-17 NOTE — Progress Notes (Signed)
 Results sent through MyChart

## 2024-06-03 MED ORDER — ALBUTEROL SULFATE HFA 108 (90 BASE) MCG/ACT IN AERS: 2.0000 | INHALATION_SPRAY | Freq: Four times a day (QID) | RESPIRATORY_TRACT | 3 refills | Status: AC | PRN

## 2025-02-25 ENCOUNTER — Encounter: Payer: Self-pay | Admitting: Medical
